# Patient Record
Sex: Female | Born: 1950
Health system: Southern US, Community
[De-identification: ages and names within clinical notes are randomized; demographics above are authoritative.]

## PROBLEM LIST (undated history)

## (undated) DIAGNOSIS — E785 Hyperlipidemia, unspecified: Secondary | ICD-10-CM

## (undated) DIAGNOSIS — C801 Malignant (primary) neoplasm, unspecified: Secondary | ICD-10-CM

## (undated) DIAGNOSIS — I10 Essential (primary) hypertension: Secondary | ICD-10-CM

## (undated) DIAGNOSIS — K219 Gastro-esophageal reflux disease without esophagitis: Secondary | ICD-10-CM

## (undated) DIAGNOSIS — M1990 Unspecified osteoarthritis, unspecified site: Secondary | ICD-10-CM

## (undated) DIAGNOSIS — G4730 Sleep apnea, unspecified: Secondary | ICD-10-CM

## (undated) DIAGNOSIS — T7840XA Allergy, unspecified, initial encounter: Secondary | ICD-10-CM

## (undated) DIAGNOSIS — F419 Anxiety disorder, unspecified: Secondary | ICD-10-CM

## (undated) DIAGNOSIS — E119 Type 2 diabetes mellitus without complications: Secondary | ICD-10-CM

## (undated) HISTORY — DX: Hyperlipidemia, unspecified: E78.5

## (undated) HISTORY — PX: TUBAL LIGATION: SHX77

## (undated) HISTORY — DX: Essential (primary) hypertension: I10

## (undated) HISTORY — PX: HEMORRHOID SURGERY: SHX153

## (undated) HISTORY — DX: Type 2 diabetes mellitus without complications: E11.9

## (undated) HISTORY — PX: STAPEDES SURGERY: SHX789

## (undated) HISTORY — PX: ABDOMINAL HYSTERECTOMY: SHX81

## (undated) HISTORY — DX: Gastro-esophageal reflux disease without esophagitis: K21.9

## (undated) HISTORY — DX: Anxiety disorder, unspecified: F41.9

## (undated) HISTORY — DX: Malignant (primary) neoplasm, unspecified: C80.1

## (undated) HISTORY — PX: CARPAL TUNNEL RELEASE: SHX101

## (undated) HISTORY — DX: Allergy, unspecified, initial encounter: T78.40XA

## (undated) HISTORY — DX: Sleep apnea, unspecified: G47.30

## (undated) HISTORY — PX: TONSILLECTOMY: SUR1361

## (undated) HISTORY — DX: Unspecified osteoarthritis, unspecified site: M19.90

---

## 2004-10-23 ENCOUNTER — Ambulatory Visit: Payer: Self-pay | Admitting: Specialist

## 2004-11-29 ENCOUNTER — Ambulatory Visit: Payer: Self-pay | Admitting: Specialist

## 2006-04-02 ENCOUNTER — Ambulatory Visit: Payer: Self-pay | Admitting: Nurse Practitioner

## 2006-04-18 ENCOUNTER — Ambulatory Visit: Payer: Self-pay | Admitting: Nurse Practitioner

## 2011-07-14 LAB — HM DEXA SCAN: HM Dexa Scan: NORMAL

## 2016-12-17 DIAGNOSIS — E785 Hyperlipidemia, unspecified: Secondary | ICD-10-CM | POA: Diagnosis not present

## 2016-12-17 DIAGNOSIS — F411 Generalized anxiety disorder: Secondary | ICD-10-CM | POA: Diagnosis not present

## 2016-12-17 DIAGNOSIS — M2550 Pain in unspecified joint: Secondary | ICD-10-CM | POA: Diagnosis not present

## 2016-12-17 DIAGNOSIS — E119 Type 2 diabetes mellitus without complications: Secondary | ICD-10-CM | POA: Diagnosis not present

## 2016-12-17 DIAGNOSIS — G2581 Restless legs syndrome: Secondary | ICD-10-CM | POA: Diagnosis not present

## 2016-12-17 DIAGNOSIS — I10 Essential (primary) hypertension: Secondary | ICD-10-CM | POA: Diagnosis not present

## 2017-01-12 DIAGNOSIS — Z13820 Encounter for screening for osteoporosis: Secondary | ICD-10-CM | POA: Diagnosis not present

## 2017-01-12 DIAGNOSIS — Z8542 Personal history of malignant neoplasm of other parts of uterus: Secondary | ICD-10-CM | POA: Diagnosis not present

## 2017-01-12 DIAGNOSIS — Z01419 Encounter for gynecological examination (general) (routine) without abnormal findings: Secondary | ICD-10-CM | POA: Diagnosis not present

## 2017-05-07 ENCOUNTER — Ambulatory Visit (INDEPENDENT_AMBULATORY_CARE_PROVIDER_SITE_OTHER): Payer: Medicare Other | Admitting: Family Medicine

## 2017-05-07 ENCOUNTER — Encounter: Payer: Self-pay | Admitting: Family Medicine

## 2017-05-07 VITALS — BP 115/86 | HR 86 | Temp 98.9°F | Resp 17 | Ht 69.0 in | Wt 202.2 lb

## 2017-05-07 DIAGNOSIS — Z1231 Encounter for screening mammogram for malignant neoplasm of breast: Secondary | ICD-10-CM | POA: Diagnosis not present

## 2017-05-07 DIAGNOSIS — K219 Gastro-esophageal reflux disease without esophagitis: Secondary | ICD-10-CM | POA: Diagnosis not present

## 2017-05-07 DIAGNOSIS — I10 Essential (primary) hypertension: Secondary | ICD-10-CM | POA: Insufficient documentation

## 2017-05-07 DIAGNOSIS — Z1239 Encounter for other screening for malignant neoplasm of breast: Secondary | ICD-10-CM

## 2017-05-07 DIAGNOSIS — E785 Hyperlipidemia, unspecified: Secondary | ICD-10-CM | POA: Diagnosis not present

## 2017-05-07 DIAGNOSIS — Z9109 Other allergy status, other than to drugs and biological substances: Secondary | ICD-10-CM

## 2017-05-07 DIAGNOSIS — M159 Polyosteoarthritis, unspecified: Secondary | ICD-10-CM

## 2017-05-07 DIAGNOSIS — E119 Type 2 diabetes mellitus without complications: Secondary | ICD-10-CM

## 2017-05-07 DIAGNOSIS — E118 Type 2 diabetes mellitus with unspecified complications: Secondary | ICD-10-CM | POA: Insufficient documentation

## 2017-05-07 DIAGNOSIS — F411 Generalized anxiety disorder: Secondary | ICD-10-CM

## 2017-05-07 DIAGNOSIS — G2581 Restless legs syndrome: Secondary | ICD-10-CM | POA: Diagnosis not present

## 2017-05-07 MED ORDER — METFORMIN HCL ER 500 MG PO TB24: 500.0000 mg | ORAL_TABLET | Freq: Every day | ORAL | 3 refills | Status: DC

## 2017-05-07 MED ORDER — ROPINIROLE HCL 1 MG PO TABS: 1.0000 mg | ORAL_TABLET | Freq: Every day | ORAL | 3 refills | Status: DC

## 2017-05-07 MED ORDER — MELOXICAM 15 MG PO TABS: 15.0000 mg | ORAL_TABLET | Freq: Every day | ORAL | 3 refills | Status: DC

## 2017-05-07 NOTE — Progress Notes (Signed)
Chief Complaint  Patient presents with  . Establish Care   Recent move to area Is primary caregiver for husband with alzheimers By history her diabetes is well controlled with last A1c "about 6 1/2" Well controlled BP on an ACE Is on Mevacor Had labs about one month ago Takes citalopram for anxiety with good result No reg exercise No acute complaint Chronic right knee pain and left shoulder pain.  Is on voltaren.  Had better result on mobic.  No x rays or treatment. No old records to review Due for eye exam No regular dental  Mammogram 2 y ago Never had dexa Doesn't need PAP Colo 4 years ago States had had all immunizations    Patient Active Problem List   Diagnosis Date Noted  . Controlled type 2 diabetes mellitus without complication, without long-term current use of insulin ([place]) 05/07/2017  . Essential hypertension 05/07/2017  . HLD (hyperlipidemia) 05/07/2017  . GAD (generalized anxiety disorder) 05/07/2017  . Environmental allergies 05/07/2017  . Restless legs syndrome (RLS) 05/07/2017  . Chronic GERD 05/07/2017  . Generalized osteoarthritis of multiple sites 05/07/2017    Outpatient Encounter Prescriptions as of 05/07/2017  Medication Sig  . b complex vitamins tablet Take 1 tablet by mouth daily.  . cholecalciferol (VITAMIN D) 1000 units tablet Take 1,000 Units by mouth daily.  . citalopram (CELEXA) 20 MG tablet Take 20 mg by mouth.  . diphenhydrAMINE (BENADRYL) 25 MG tablet Take 25 mg by mouth every 6 (six) hours as needed.  Marland Kitchen lisinopril (PRINIVIL,ZESTRIL) 5 MG tablet Take 5 mg by mouth daily.  Marland Kitchen lovastatin (MEVACOR) 20 MG tablet Take 20 mg by mouth.  . metFORMIN (GLUCOPHAGE) 500 MG tablet Take 500 mg by mouth.  . ranitidine (ZANTAC) 75 MG tablet Take 75 mg by mouth 2 (two) times daily.  Marland Kitchen rOPINIRole (REQUIP) 1 MG tablet Take 1 tablet (1 mg total) by mouth at bedtime.  . vitamin E 400 UNIT capsule Take 400 Units by mouth daily.  . meloxicam (MOBIC) 15 MG  tablet Take 1 tablet (15 mg total) by mouth daily.   No facility-administered encounter medications on file as of 05/07/2017.     Past Medical History:  Diagnosis Date  . Allergy    hay fever  . Anxiety   . Arthritis    R knee. R foot, L [place]  . Cancer [facility])    hysterectomy 2009 cervical  . Diabetes mellitus without complication ([place])   . GERD (gastroesophageal reflux disease)   . Hyperlipidemia   . Hypertension   . Sleep apnea    no longer uses    Past Surgical History:  Procedure Laterality Date  . ABDOMINAL HYSTERECTOMY     2009  . CARPAL TUNNEL RELEASE     both hands  . HEMORRHOID SURGERY     in high school  . STAPEDES SURGERY     had 3 surgeries  . TONSILLECTOMY    . TUBAL LIGATION      Social History   Social History  . Marital status: Married    Spouse name: Yvone Neu  . Number of children: 2  . Years of education: 12   Occupational History  . retired     Medical sales representative, self   Social History Main Topics  . Smoking status: Never Smoker  . Smokeless tobacco: Never Used  . Alcohol use No  . Drug use: No  . Sexual activity: No   Other Topics Concern  . Not on file  Social History Narrative   Moved from [place]   Lives with husband with Alzheimers   Has 4 little dogs   Rented trailer    Family History  Problem Relation Age of Onset  . Arthritis Mother        "crippled"  . Diabetes Mother   . Hearing loss Mother   . Heart disease Mother   . Hearing loss Father   . Vision loss Father   . Cancer Brother        bladder  . Diabetes Brother   . Hyperlipidemia Brother   . Early death Maternal Grandfather     Review of Systems  Constitutional: Negative for chills, fever and weight loss.  HENT: Negative for congestion and hearing loss.        Dental pain  Eyes: Negative for blurred vision and pain.  Respiratory: Negative for cough and shortness of breath.   Cardiovascular: Negative for chest pain and leg swelling.  Gastrointestinal: Negative for  abdominal pain, constipation, diarrhea and heartburn.  Genitourinary: Negative for dysuria and frequency.  Musculoskeletal: Positive for joint pain. Negative for falls and myalgias.  Neurological: Negative for dizziness, seizures and headaches.  Psychiatric/Behavioral: Negative for depression. The patient is not nervous/anxious and does not have insomnia.     BP 115/86 (BP Location: Left Arm, Patient Position: Sitting, Cuff Size: Normal)   Pulse 86   Temp 98.9 F (37.2 C) (Other (Comment))   Resp 17   Ht 5\' 9"  (1.753 m)   Wt 202 lb 4 oz (91.7 kg)   SpO2 97%   BMI 29.87 kg/m   Physical Exam  Constitutional: She is oriented to person, place, and time. She appears well-developed and well-nourished.  HENT:  Head: Normocephalic and atraumatic.  Mouth/Throat: Oropharynx is clear and moist.  Eyes: Pupils are equal, round, and reactive to light. Conjunctivae are normal.  Neck: Normal range of motion. Neck supple. No thyromegaly present.  Cardiovascular: Normal rate, regular rhythm and normal heart sounds.   Pulmonary/Chest: Effort normal and breath sounds normal. No respiratory distress.  Musculoskeletal: Normal range of motion. She exhibits no edema.  Right knee with warmth and slight effusion.  FROM.  Medial joint line tender.  Left shoulder demonstrates pain with internal rotation and mild pos impingement  Lymphadenopathy:    She has no cervical adenopathy.  Neurological: She is alert and oriented to person, place, and time.  Gait normal  Skin: Skin is warm and dry.  Psychiatric: She has a normal mood and affect. Her behavior is normal. Thought content normal.  Nursing note and vitals reviewed. ASSESSMENT/PLAN:   1. Controlled type 2 diabetes mellitus without complication, without long-term current use of insulin ([place]) On metformin  2. Essential hypertension controlled  3. Hyperlipidemia, unspecified hyperlipidemia type Will get labs  4. GAD (generalized anxiety  disorder) controlled  5. Environmental allergies Prn benadryl  6. Restless legs syndrome (RLS) On requip  7. Chronic GERD Controlled with OTC  8. Generalized osteoarthritis of multiple sites Switch voltaren to mobic  9. Screening for breast cancer - MM Digital Screening; Future   Patient Instructions  Change voltaren to mobic Take once a day with food Watch for stomach upset  I have refilled the requip 1 mg  Stay as active as you can manage  Need old records  Come back for labs and a check up / PE in 3 months   Audrey Everts, MD

## 2017-05-07 NOTE — Patient Instructions (Signed)
Change voltaren to mobic Take once a day with food Watch for stomach upset  I have refilled the requip 1 mg  Stay as active as you can manage  Need old records  Come back for labs and a check up / PE in 3 months

## 2017-05-11 ENCOUNTER — Other Ambulatory Visit: Payer: Self-pay

## 2017-05-11 MED ORDER — CITALOPRAM HYDROBROMIDE 20 MG PO TABS: 20.0000 mg | ORAL_TABLET | Freq: Every day | ORAL | 3 refills | Status: DC

## 2017-05-12 ENCOUNTER — Encounter: Payer: Self-pay | Admitting: Family Medicine

## 2017-05-13 ENCOUNTER — Other Ambulatory Visit: Payer: Self-pay

## 2017-05-13 ENCOUNTER — Telehealth: Payer: Self-pay

## 2017-05-13 MED ORDER — CITALOPRAM HYDROBROMIDE 40 MG PO TABS: 40.0000 mg | ORAL_TABLET | Freq: Every day | ORAL | 3 refills | Status: DC

## 2017-05-13 NOTE — Telephone Encounter (Signed)
Sure, she can take 40 a day

## 2017-05-13 NOTE — Telephone Encounter (Signed)
Do you want to increase to 40 mg?

## 2017-05-15 LAB — HM DIABETES EYE EXAM

## 2017-06-24 ENCOUNTER — Ambulatory Visit (INDEPENDENT_AMBULATORY_CARE_PROVIDER_SITE_OTHER): Payer: Medicare Other

## 2017-06-24 DIAGNOSIS — Z23 Encounter for immunization: Secondary | ICD-10-CM

## 2017-06-26 ENCOUNTER — Other Ambulatory Visit: Payer: Self-pay | Admitting: Family Medicine

## 2017-06-26 MED ORDER — LISINOPRIL 5 MG PO TABS: 5.0000 mg | ORAL_TABLET | Freq: Every day | ORAL | 3 refills | Status: DC

## 2017-08-07 ENCOUNTER — Ambulatory Visit: Payer: Medicare Other | Admitting: Family Medicine

## 2017-08-25 ENCOUNTER — Encounter: Payer: Self-pay | Admitting: Family Medicine

## 2017-08-25 ENCOUNTER — Ambulatory Visit (INDEPENDENT_AMBULATORY_CARE_PROVIDER_SITE_OTHER): Payer: Medicare Other | Admitting: Family Medicine

## 2017-08-25 ENCOUNTER — Other Ambulatory Visit: Payer: Self-pay

## 2017-08-25 VITALS — BP 118/68 | HR 84 | Temp 97.8°F | Resp 16 | Ht 69.0 in | Wt 202.0 lb

## 2017-08-25 DIAGNOSIS — E785 Hyperlipidemia, unspecified: Secondary | ICD-10-CM

## 2017-08-25 DIAGNOSIS — Z1231 Encounter for screening mammogram for malignant neoplasm of breast: Secondary | ICD-10-CM

## 2017-08-25 DIAGNOSIS — Z780 Asymptomatic menopausal state: Secondary | ICD-10-CM

## 2017-08-25 DIAGNOSIS — Z1239 Encounter for other screening for malignant neoplasm of breast: Secondary | ICD-10-CM

## 2017-08-25 DIAGNOSIS — E119 Type 2 diabetes mellitus without complications: Secondary | ICD-10-CM | POA: Diagnosis not present

## 2017-08-25 DIAGNOSIS — Z1159 Encounter for screening for other viral diseases: Secondary | ICD-10-CM

## 2017-08-25 DIAGNOSIS — E538 Deficiency of other specified B group vitamins: Secondary | ICD-10-CM | POA: Diagnosis not present

## 2017-08-25 DIAGNOSIS — E559 Vitamin D deficiency, unspecified: Secondary | ICD-10-CM | POA: Diagnosis not present

## 2017-08-25 DIAGNOSIS — I10 Essential (primary) hypertension: Secondary | ICD-10-CM

## 2017-08-25 DIAGNOSIS — K219 Gastro-esophageal reflux disease without esophagitis: Secondary | ICD-10-CM | POA: Diagnosis not present

## 2017-08-25 NOTE — Progress Notes (Signed)
Chief Complaint  Patient presents with  . Follow-up    3 month  Patient is feeling well.  She is here for routine follow-up. She has not yet gotten her mammogram.  Mammogram and DEXA scan are reordered for her. She is due for blood work. She states that she feels well.  She thinks her sugars have been well controlled.  She has not had any symptoms or high or low sugar. Her blood pressure is well controlled. She has no heartburn while taking her Zantac.  She is on Mobic for arthritis pain.  She is due for kidney function testing. She has not been screened for hepatitis C.  This is ordered today. She does not have any neuropathy or foot problems.  Foot exam is normal today. She acknowledges that she is due for an ophthalmology visit.   Patient Active Problem List   Diagnosis Date Noted  . Controlled type 2 diabetes mellitus without complication, without long-term current use of insulin ([place]) 05/07/2017  . Essential hypertension 05/07/2017  . HLD (hyperlipidemia) 05/07/2017  . GAD (generalized anxiety disorder) 05/07/2017  . Environmental allergies 05/07/2017  . Restless legs syndrome (RLS) 05/07/2017  . Chronic GERD 05/07/2017  . Generalized osteoarthritis of multiple sites 05/07/2017    Outpatient Encounter Medications as of 08/25/2017  Medication Sig  . b complex vitamins tablet Take 1 tablet by mouth daily.  . cholecalciferol (VITAMIN D) 1000 units tablet Take 1,000 Units by mouth daily.  . citalopram (CELEXA) 40 MG tablet Take 1 tablet (40 mg total) by mouth daily.  . diphenhydrAMINE (BENADRYL) 25 MG tablet Take 25 mg by mouth every 6 (six) hours as needed.  Marland Kitchen lisinopril (PRINIVIL,ZESTRIL) 5 MG tablet Take 1 tablet (5 mg total) by mouth daily.  Marland Kitchen lovastatin (MEVACOR) 20 MG tablet Take 20 mg by mouth.  . meloxicam (MOBIC) 15 MG tablet Take 1 tablet (15 mg total) by mouth daily.  . metFORMIN (GLUCOPHAGE-XR) 500 MG 24 hr tablet Take 1 tablet (500 mg total) by mouth daily with  breakfast.  . ranitidine (ZANTAC) 75 MG tablet Take 75 mg by mouth 2 (two) times daily.  Marland Kitchen rOPINIRole (REQUIP) 1 MG tablet Take 1 tablet (1 mg total) by mouth at bedtime.  . vitamin E 400 UNIT capsule Take 400 Units by mouth daily.   No facility-administered encounter medications on file as of 08/25/2017.     No Known Allergies  Review of Systems  Constitutional: Negative for activity change, appetite change and unexpected weight change.  HENT: Negative for congestion, dental problem, postnasal drip and rhinorrhea.   Eyes: Negative for redness and visual disturbance.  Respiratory: Negative for cough and shortness of breath.   Cardiovascular: Negative for chest pain, palpitations and leg swelling.  Gastrointestinal: Negative for abdominal pain, constipation and diarrhea.  Genitourinary: Positive for frequency. Negative for difficulty urinating and vaginal bleeding.  Musculoskeletal: Negative for arthralgias and back pain.  Neurological: Negative for dizziness and headaches.  Psychiatric/Behavioral: Negative for dysphoric mood and sleep disturbance. The patient is not nervous/anxious.     BP 118/68 (BP Location: Right Arm, Patient Position: Sitting, Cuff Size: Normal)   Pulse 84   Temp 97.8 F (36.6 C) (Temporal)   Resp 16   Ht 5\' 9"  (1.753 m)   Wt 202 lb 0.6 oz (91.6 kg)   SpO2 99%   BMI 29.84 kg/m   Physical Exam  Constitutional: She is oriented to person, place, and time. She appears well-developed and well-nourished.  Mild  overweight  HENT:  Head: Normocephalic and atraumatic.  Mouth/Throat: Oropharynx is clear and moist.  Eyes: Conjunctivae are normal. Pupils are equal, round, and reactive to light.  Neck: Normal range of motion. Neck supple. No thyromegaly present.  Cardiovascular: Normal rate, regular rhythm and normal heart sounds.  Pulses:      Dorsalis pedis pulses are 2+ on the right side, and 2+ on the left side.       Posterior tibial pulses are 2+ on the right  side, and 2+ on the left side.  Pulmonary/Chest: Effort normal and breath sounds normal. No respiratory distress.  Musculoskeletal: Normal range of motion. She exhibits no edema.       Right foot: There is normal range of motion and no deformity.       Left foot: There is normal range of motion and no deformity.  Feet:  Right Foot:  Protective Sensation: 3 sites tested. 3 sites sensed.  Skin Integrity: Negative for ulcer, blister or skin breakdown.  Left Foot:  Protective Sensation: 3 sites tested. 3 sites sensed.  Skin Integrity: Negative for ulcer, blister or skin breakdown.  Lymphadenopathy:    She has no cervical adenopathy.  Neurological: She is alert and oriented to person, place, and time.  Gait normal  Skin: Skin is warm and dry.  Psychiatric: She has a normal mood and affect. Her behavior is normal. Thought content normal.  Nursing note and vitals reviewed.   ASSESSMENT/PLAN:  1. Hyperlipidemia, unspecified hyperlipidemia type Compliant with Mevacor.  To get lipid panel today.  2. Essential hypertension Compliant with medication.  Blood pressure well controlled.  3. Chronic GERD Symptoms controlled on Zantac  4. Controlled type 2 diabetes mellitus without complication, without long-term current use of insulin ([place]) Controlled by patient history.  She does not check her sugar regularly.  We will check a hemoglobin A1c today. - COMPLETE METABOLIC PANEL WITH GFR - Hemoglobin A1c - CBC - Urinalysis, Routine w reflex microscopic - Lipid panel  5. Vitamin D deficiency Remote history - VITAMIN D 25 Hydroxy (Vit-D Deficiency, Fractures)  6. Vitamin B12 deficiency Remote history - Vitamin B12  7. Encounter for hepatitis C screening test for low risk patient Discussed with patient - Hepatitis C antibody  8. Screening for breast cancer Overdue - MM Digital Screening; Future  9. Post-menopausal Overdue.  Last done in 2012.  Normal - DG Bone Density;  Future   Patient Instructions  Need lab testing Continue to eat well and stay active I will send you a letter with your test results.  If there is anything of concern, we will call right away. See me every six month   Raylene Everts, MD

## 2017-08-25 NOTE — Patient Instructions (Signed)
Need lab testing Continue to eat well and stay active I will send you a letter with your test results.  If there is anything of concern, we will call right away. See me every six month

## 2017-08-26 ENCOUNTER — Other Ambulatory Visit: Payer: Self-pay | Admitting: Family Medicine

## 2017-08-26 LAB — URINALYSIS, ROUTINE W REFLEX MICROSCOPIC
BILIRUBIN URINE: NEGATIVE
Glucose, UA: NEGATIVE
Hgb urine dipstick: NEGATIVE
KETONES UR: NEGATIVE
Leukocytes, UA: NEGATIVE
NITRITE: NEGATIVE
PROTEIN: NEGATIVE
SPECIFIC GRAVITY, URINE: 1.025 (ref 1.001–1.03)
pH: 7 (ref 5.0–8.0)

## 2017-08-26 LAB — COMPLETE METABOLIC PANEL WITH GFR
AG RATIO: 1.6 (calc) (ref 1.0–2.5)
ALBUMIN MSPROF: 4.1 g/dL (ref 3.6–5.1)
ALKALINE PHOSPHATASE (APISO): 51 U/L (ref 33–130)
ALT: 12 U/L (ref 6–29)
AST: 14 U/L (ref 10–35)
BILIRUBIN TOTAL: 0.4 mg/dL (ref 0.2–1.2)
BUN: 15 mg/dL (ref 7–25)
CHLORIDE: 100 mmol/L (ref 98–110)
CO2: 32 mmol/L (ref 20–32)
Calcium: 9.4 mg/dL (ref 8.6–10.4)
Creat: 0.85 mg/dL (ref 0.50–0.99)
GFR, EST AFRICAN AMERICAN: 82 mL/min/{1.73_m2} (ref >=60)
GFR, Est Non African American: 70 mL/min/{1.73_m2} (ref >=60)
GLOBULIN: 2.5 g/dL (ref 1.9–3.7)
Glucose, Bld: 122 mg/dL — ABNORMAL HIGH (ref 65–99)
Potassium: 4 mmol/L (ref 3.5–5.3)
SODIUM: 137 mmol/L (ref 135–146)
TOTAL PROTEIN: 6.6 g/dL (ref 6.1–8.1)

## 2017-08-26 LAB — LIPID PANEL
CHOL/HDL RATIO: 3.6 (calc) (ref <5.0)
CHOLESTEROL: 233 mg/dL — AB (ref <200)
HDL: 65 mg/dL (ref >50)
LDL Cholesterol (Calc): 143 mg/dL (calc) — ABNORMAL HIGH
NON-HDL CHOLESTEROL (CALC): 168 mg/dL — AB (ref <130)
Triglycerides: 130 mg/dL (ref <150)

## 2017-08-26 LAB — VITAMIN D 25 HYDROXY (VIT D DEFICIENCY, FRACTURES): VIT D 25 HYDROXY: 36 ng/mL (ref 30–100)

## 2017-08-26 LAB — CBC
HCT: 38.5 % (ref 35.0–45.0)
HEMOGLOBIN: 13.1 g/dL (ref 11.7–15.5)
MCH: 30.8 pg (ref 27.0–33.0)
MCHC: 34 g/dL (ref 32.0–36.0)
MCV: 90.6 fL (ref 80.0–100.0)
MPV: 10 fL (ref 7.5–12.5)
Platelets: 281 10*3/uL (ref 140–400)
RBC: 4.25 10*6/uL (ref 3.80–5.10)
RDW: 12.1 % (ref 11.0–15.0)
WBC: 6.2 10*3/uL (ref 3.8–10.8)

## 2017-08-26 LAB — HEPATITIS C ANTIBODY
Hepatitis C Ab: NONREACTIVE
SIGNAL TO CUT-OFF: 0.14 (ref <1.00)

## 2017-08-26 LAB — HEMOGLOBIN A1C
HEMOGLOBIN A1C: 5.9 %{Hb} — AB (ref <5.7)
Mean Plasma Glucose: 123 (calc)
eAG (mmol/L): 6.8 (calc)

## 2017-08-26 LAB — VITAMIN B12: Vitamin B-12: 487 pg/mL (ref 200–1100)

## 2017-08-26 MED ORDER — LOVASTATIN 40 MG PO TABS: 40.0000 mg | ORAL_TABLET | Freq: Every day | ORAL | 3 refills | Status: DC

## 2017-09-15 ENCOUNTER — Other Ambulatory Visit: Payer: Self-pay

## 2017-10-05 ENCOUNTER — Encounter: Payer: Self-pay | Admitting: Family Medicine

## 2017-10-07 ENCOUNTER — Other Ambulatory Visit: Payer: Self-pay

## 2017-10-07 MED ORDER — HYDROCHLOROTHIAZIDE 25 MG PO TABS: 25.0000 mg | ORAL_TABLET | Freq: Every day | ORAL | 3 refills | Status: DC

## 2017-10-07 NOTE — Telephone Encounter (Signed)
Seen 11 20 18

## 2017-10-27 ENCOUNTER — Encounter: Payer: Self-pay | Admitting: Family Medicine

## 2017-11-09 ENCOUNTER — Other Ambulatory Visit: Payer: Self-pay

## 2017-11-09 ENCOUNTER — Encounter: Payer: Self-pay | Admitting: Family Medicine

## 2017-11-09 ENCOUNTER — Ambulatory Visit (INDEPENDENT_AMBULATORY_CARE_PROVIDER_SITE_OTHER): Payer: Medicare HMO | Admitting: Family Medicine

## 2017-11-09 VITALS — BP 114/68 | HR 80 | Temp 98.6°F | Resp 18 | Ht 69.0 in | Wt 209.0 lb

## 2017-11-09 DIAGNOSIS — S40862A Insect bite (nonvenomous) of left upper arm, initial encounter: Secondary | ICD-10-CM

## 2017-11-09 DIAGNOSIS — M25552 Pain in left hip: Secondary | ICD-10-CM | POA: Diagnosis not present

## 2017-11-09 DIAGNOSIS — W57XXXA Bitten or stung by nonvenomous insect and other nonvenomous arthropods, initial encounter: Secondary | ICD-10-CM

## 2017-11-09 DIAGNOSIS — G8929 Other chronic pain: Secondary | ICD-10-CM | POA: Diagnosis not present

## 2017-11-09 MED ORDER — TRAMADOL HCL 50 MG PO TABS: 50.0000 mg | ORAL_TABLET | Freq: Four times a day (QID) | ORAL | 0 refills | Status: DC | PRN

## 2017-11-09 NOTE — Progress Notes (Signed)
Chief Complaint  Patient presents with  . Tick Removal   Patient is here for 2 reasons. She is seen today as a walk-in for tick removal.  She has a tick on her left breast/axilla region.  She is unable to see it adequately to get it off. She is also complaining of chronic left hip pain.  This is been going on for some time.  She takes Mobic every day.  She points to her SI area.  It hurts in her hip and buttock.  Is worse with activity and better with rest.  It does bother her somewhat at night.  No trauma or accident.  She is always physically active, [place]  Patient Active Problem List   Diagnosis Date Noted  . Controlled type 2 diabetes mellitus without complication, without long-term current use of insulin ([place]) 05/07/2017  . Essential hypertension 05/07/2017  . HLD (hyperlipidemia) 05/07/2017  . GAD (generalized anxiety disorder) 05/07/2017  . Environmental allergies 05/07/2017  . Restless legs syndrome (RLS) 05/07/2017  . Chronic GERD 05/07/2017  . Generalized osteoarthritis of multiple sites 05/07/2017    Outpatient Encounter Medications as of 11/09/2017  Medication Sig  . b complex vitamins tablet Take 1 tablet by mouth daily.  . cholecalciferol (VITAMIN D) 1000 units tablet Take 1,000 Units by mouth daily.  . citalopram (CELEXA) 40 MG tablet Take 1 tablet (40 mg total) by mouth daily.  . diphenhydrAMINE (BENADRYL) 25 MG tablet Take 25 mg by mouth every 6 (six) hours as needed.  . hydrochlorothiazide (HYDRODIURIL) 25 MG tablet Take 1 tablet (25 mg total) by mouth daily.  Marland Kitchen lisinopril (PRINIVIL,ZESTRIL) 5 MG tablet Take 1 tablet (5 mg total) by mouth daily.  Marland Kitchen lovastatin (MEVACOR) 40 MG tablet Take 1 tablet (40 mg total) by mouth daily at 8 pm.  . meloxicam (MOBIC) 15 MG tablet Take 1 tablet (15 mg total) by mouth daily.  . metFORMIN (GLUCOPHAGE-XR) 500 MG 24 hr tablet Take 1 tablet (500 mg total) by mouth daily with breakfast.  . ranitidine (ZANTAC) 75  MG tablet Take 75 mg by mouth 2 (two) times daily.  Marland Kitchen rOPINIRole (REQUIP) 1 MG tablet Take 1 tablet (1 mg total) by mouth at bedtime.  . vitamin E 400 UNIT capsule Take 400 Units by mouth daily.  . traMADol (ULTRAM) 50 MG tablet Take 1 tablet (50 mg total) by mouth every 6 (six) hours as needed.   No facility-administered encounter medications on file as of 11/09/2017.     No Known Allergies  Review of Systems  Constitutional: Negative for activity change, appetite change and unexpected weight change.  HENT: Negative for congestion, dental problem, postnasal drip and rhinorrhea.   Eyes: Negative for redness and visual disturbance.  Respiratory: Negative for cough and shortness of breath.   Cardiovascular: Negative for chest pain, palpitations and leg swelling.  Gastrointestinal: Negative for abdominal pain, constipation and diarrhea.  Genitourinary: Negative for difficulty urinating, frequency and menstrual problem.  Musculoskeletal: Positive for arthralgias and gait problem. Negative for back pain.  Skin: Positive for wound.  Neurological: Negative for dizziness and headaches.  Psychiatric/Behavioral: Negative for dysphoric mood and sleep disturbance. The patient is not nervous/anxious.     BP 114/68 (BP Location: Right Arm, Patient Position: Sitting, Cuff Size: Normal)   Pulse 80   Temp 98.6 F (37 C) (Temporal)   Resp 18   Ht 5\' 9"  (1.753 m)   Wt 209 lb 0.6 oz (94.8 kg)  SpO2 99%   BMI 30.87 kg/m   Physical Exam  Constitutional: She appears well-developed and well-nourished. No distress.  HENT:  Head: Normocephalic.  Mouth/Throat: Oropharynx is clear and moist.  Eyes: Conjunctivae are normal. Pupils are equal, round, and reactive to light.  Neck: Normal range of motion.  Cardiovascular: Normal rate, regular rhythm and normal heart sounds.  Pulmonary/Chest: Effort normal and breath sounds normal.  Musculoskeletal: Normal range of motion. She exhibits no edema.  Mild to  moderate tenderness to palpation over left SI joint.  Spine is straight and symmetric.  No muscular spasm or tenderness.  Strength sensation range of motion and reflexes are normal and lower extremities.  No pain with range of motion of left hip.  Lymphadenopathy:    She has no cervical adenopathy.  Skin:  In left anterior axillary region there is a mildly excoriated wound with a black center that looks like tick mouth parts.  With needle and tweezers this is removed.  The area is cleansed.  Band-Aid is placed.    ASSESSMENT/PLAN:  1. Tick bite of axillary region, left, initial encounter Discussed the patient not need prophylaxis for Lyme disease given the short period of time the tick was attached to her skin.  She has no erythema or signs of infection.  2. Chronic hip pain, left She is already on Mobic daily.  Will add tramadol as needed severe pain.  Stretching and range of motion is recommended.   Patient Instructions  Watch tick bite area for signs of infection  Take mobic once a day for the hip pain Take tramadol if needed for pain  See me as scheduled   Raylene Everts, MD

## 2017-11-09 NOTE — Patient Instructions (Signed)
Watch tick bite area for signs of infection  Take mobic once a day for the hip pain Take tramadol if needed for pain  See me as scheduled

## 2017-12-14 ENCOUNTER — Encounter: Payer: Self-pay | Admitting: Family Medicine

## 2018-02-22 ENCOUNTER — Ambulatory Visit: Payer: Medicare Other | Admitting: Family Medicine

## 2018-03-02 DIAGNOSIS — E7849 Other hyperlipidemia: Secondary | ICD-10-CM | POA: Diagnosis not present

## 2018-03-02 DIAGNOSIS — R5382 Chronic fatigue, unspecified: Secondary | ICD-10-CM | POA: Diagnosis not present

## 2018-03-02 DIAGNOSIS — M2550 Pain in unspecified joint: Secondary | ICD-10-CM | POA: Diagnosis not present

## 2018-03-24 ENCOUNTER — Other Ambulatory Visit (HOSPITAL_COMMUNITY): Payer: Self-pay | Admitting: Family Medicine

## 2018-03-24 ENCOUNTER — Ambulatory Visit (HOSPITAL_COMMUNITY)
Admission: RE | Admit: 2018-03-24 | Discharge: 2018-03-24 | Disposition: A | Discharge status: Home / Self Care | Payer: Medicare HMO | Source: Ambulatory Visit | Attending: Family Medicine | Admitting: Family Medicine

## 2018-03-24 DIAGNOSIS — R52 Pain, unspecified: Secondary | ICD-10-CM

## 2018-03-24 DIAGNOSIS — M2011 Hallux valgus (acquired), right foot: Secondary | ICD-10-CM | POA: Diagnosis not present

## 2018-03-24 DIAGNOSIS — R5383 Other fatigue: Secondary | ICD-10-CM | POA: Diagnosis not present

## 2018-03-24 DIAGNOSIS — I110 Hypertensive heart disease with heart failure: Secondary | ICD-10-CM | POA: Diagnosis not present

## 2018-03-24 DIAGNOSIS — E559 Vitamin D deficiency, unspecified: Secondary | ICD-10-CM | POA: Diagnosis not present

## 2018-03-24 DIAGNOSIS — E7849 Other hyperlipidemia: Secondary | ICD-10-CM | POA: Diagnosis not present

## 2018-03-24 DIAGNOSIS — E1165 Type 2 diabetes mellitus with hyperglycemia: Secondary | ICD-10-CM | POA: Diagnosis not present

## 2018-03-24 DIAGNOSIS — M25571 Pain in right ankle and joints of right foot: Secondary | ICD-10-CM | POA: Diagnosis not present

## 2018-03-24 DIAGNOSIS — D510 Vitamin B12 deficiency anemia due to intrinsic factor deficiency: Secondary | ICD-10-CM | POA: Diagnosis not present

## 2018-03-24 DIAGNOSIS — M79671 Pain in right foot: Secondary | ICD-10-CM | POA: Diagnosis not present

## 2018-04-30 DIAGNOSIS — E7849 Other hyperlipidemia: Secondary | ICD-10-CM | POA: Diagnosis not present

## 2018-04-30 DIAGNOSIS — R5382 Chronic fatigue, unspecified: Secondary | ICD-10-CM | POA: Diagnosis not present

## 2018-04-30 DIAGNOSIS — I110 Hypertensive heart disease with heart failure: Secondary | ICD-10-CM | POA: Diagnosis not present

## 2018-04-30 DIAGNOSIS — E1165 Type 2 diabetes mellitus with hyperglycemia: Secondary | ICD-10-CM | POA: Diagnosis not present

## 2018-06-04 ENCOUNTER — Other Ambulatory Visit: Payer: Self-pay | Admitting: Family Medicine

## 2018-08-11 DIAGNOSIS — M7552 Bursitis of left shoulder: Secondary | ICD-10-CM | POA: Diagnosis not present

## 2018-08-11 DIAGNOSIS — I110 Hypertensive heart disease with heart failure: Secondary | ICD-10-CM | POA: Diagnosis not present

## 2018-08-11 DIAGNOSIS — J309 Allergic rhinitis, unspecified: Secondary | ICD-10-CM | POA: Diagnosis not present

## 2018-08-11 DIAGNOSIS — E7849 Other hyperlipidemia: Secondary | ICD-10-CM | POA: Diagnosis not present

## 2018-08-11 DIAGNOSIS — R69 Illness, unspecified: Secondary | ICD-10-CM | POA: Diagnosis not present

## 2018-08-16 ENCOUNTER — Ambulatory Visit (HOSPITAL_COMMUNITY)
Admission: RE | Admit: 2018-08-16 | Discharge: 2018-08-16 | Disposition: A | Discharge status: Home / Self Care | Payer: Medicare HMO | Source: Ambulatory Visit | Attending: Family Medicine | Admitting: Family Medicine

## 2018-08-16 ENCOUNTER — Other Ambulatory Visit (HOSPITAL_COMMUNITY): Payer: Self-pay | Admitting: Family Medicine

## 2018-08-16 DIAGNOSIS — M7552 Bursitis of left shoulder: Secondary | ICD-10-CM

## 2018-08-16 DIAGNOSIS — M19012 Primary osteoarthritis, left shoulder: Secondary | ICD-10-CM | POA: Diagnosis not present

## 2018-09-07 ENCOUNTER — Other Ambulatory Visit: Payer: Self-pay

## 2018-09-07 ENCOUNTER — Ambulatory Visit: Payer: Medicare HMO

## 2018-09-07 DIAGNOSIS — E119 Type 2 diabetes mellitus without complications: Secondary | ICD-10-CM

## 2018-09-07 MED ORDER — METFORMIN HCL ER 500 MG PO TB24: 500.0000 mg | ORAL_TABLET | Freq: Every day | ORAL | 3 refills | Status: DC

## 2018-09-07 NOTE — Progress Notes (Signed)
Medication refilled

## 2018-09-20 ENCOUNTER — Ambulatory Visit: Payer: Medicare HMO

## 2019-04-07 ENCOUNTER — Other Ambulatory Visit: Payer: Medicare HMO

## 2019-04-07 ENCOUNTER — Other Ambulatory Visit: Payer: Self-pay

## 2019-04-07 DIAGNOSIS — Z20822 Contact with and (suspected) exposure to covid-19: Secondary | ICD-10-CM

## 2019-04-13 LAB — NOVEL CORONAVIRUS, NAA: SARS-CoV-2, NAA: NOT DETECTED

## 2019-05-26 ENCOUNTER — Ambulatory Visit: Payer: Medicare HMO | Admitting: Family Medicine

## 2019-06-02 ENCOUNTER — Ambulatory Visit: Payer: Medicare HMO | Admitting: Family Medicine

## 2019-07-22 ENCOUNTER — Other Ambulatory Visit: Payer: Self-pay

## 2019-07-22 DIAGNOSIS — Z20822 Contact with and (suspected) exposure to covid-19: Secondary | ICD-10-CM

## 2019-07-23 LAB — NOVEL CORONAVIRUS, NAA: SARS-CoV-2, NAA: NOT DETECTED

## 2019-12-27 ENCOUNTER — Other Ambulatory Visit: Payer: Self-pay | Admitting: Family Medicine

## 2019-12-27 DIAGNOSIS — E119 Type 2 diabetes mellitus without complications: Secondary | ICD-10-CM

## 2020-01-02 ENCOUNTER — Other Ambulatory Visit: Payer: Self-pay | Admitting: Family Medicine

## 2020-01-02 DIAGNOSIS — E119 Type 2 diabetes mellitus without complications: Secondary | ICD-10-CM

## 2020-09-14 ENCOUNTER — Other Ambulatory Visit (HOSPITAL_COMMUNITY): Payer: Self-pay | Admitting: Family Medicine

## 2020-09-14 DIAGNOSIS — M545 Low back pain, unspecified: Secondary | ICD-10-CM

## 2020-09-14 DIAGNOSIS — M1909 Primary osteoarthritis, other specified site: Secondary | ICD-10-CM

## 2020-11-14 DIAGNOSIS — E1165 Type 2 diabetes mellitus with hyperglycemia: Secondary | ICD-10-CM | POA: Diagnosis not present

## 2020-11-14 DIAGNOSIS — R5382 Chronic fatigue, unspecified: Secondary | ICD-10-CM | POA: Diagnosis not present

## 2021-01-09 DIAGNOSIS — K219 Gastro-esophageal reflux disease without esophagitis: Secondary | ICD-10-CM | POA: Diagnosis not present

## 2021-01-09 DIAGNOSIS — E1165 Type 2 diabetes mellitus with hyperglycemia: Secondary | ICD-10-CM | POA: Diagnosis not present

## 2021-01-09 DIAGNOSIS — E7849 Other hyperlipidemia: Secondary | ICD-10-CM | POA: Diagnosis not present

## 2021-01-09 DIAGNOSIS — I110 Hypertensive heart disease with heart failure: Secondary | ICD-10-CM | POA: Diagnosis not present

## 2021-01-17 ENCOUNTER — Other Ambulatory Visit: Payer: Self-pay

## 2021-01-17 ENCOUNTER — Other Ambulatory Visit (HOSPITAL_COMMUNITY): Payer: Self-pay | Admitting: Family Medicine

## 2021-01-17 ENCOUNTER — Ambulatory Visit (HOSPITAL_COMMUNITY)
Admission: RE | Admit: 2021-01-17 | Discharge: 2021-01-17 | Disposition: A | Discharge status: Home / Self Care | Payer: Medicare HMO | Source: Ambulatory Visit | Attending: Family Medicine | Admitting: Family Medicine

## 2021-01-17 DIAGNOSIS — E230 Hypopituitarism: Secondary | ICD-10-CM | POA: Diagnosis not present

## 2021-01-17 DIAGNOSIS — M545 Low back pain, unspecified: Secondary | ICD-10-CM | POA: Diagnosis not present

## 2021-01-17 DIAGNOSIS — E288 Other ovarian dysfunction: Secondary | ICD-10-CM | POA: Diagnosis not present

## 2021-01-17 DIAGNOSIS — E280 Estrogen excess: Secondary | ICD-10-CM | POA: Diagnosis not present

## 2021-03-29 DIAGNOSIS — M2559 Pain in other specified joint: Secondary | ICD-10-CM | POA: Diagnosis not present

## 2021-03-29 DIAGNOSIS — E1165 Type 2 diabetes mellitus with hyperglycemia: Secondary | ICD-10-CM | POA: Diagnosis not present

## 2021-03-29 DIAGNOSIS — M5459 Other low back pain: Secondary | ICD-10-CM | POA: Diagnosis not present

## 2021-04-09 ENCOUNTER — Other Ambulatory Visit (HOSPITAL_COMMUNITY): Payer: Self-pay | Admitting: Family Medicine

## 2021-04-09 DIAGNOSIS — M545 Low back pain, unspecified: Secondary | ICD-10-CM

## 2021-04-19 ENCOUNTER — Encounter (HOSPITAL_COMMUNITY): Payer: Self-pay

## 2021-04-19 ENCOUNTER — Ambulatory Visit (HOSPITAL_COMMUNITY): Payer: Medicare HMO

## 2021-08-27 ENCOUNTER — Ambulatory Visit: Payer: Medicare HMO | Admitting: Nurse Practitioner

## 2021-10-29 ENCOUNTER — Encounter: Payer: Self-pay | Admitting: Nurse Practitioner

## 2021-10-29 ENCOUNTER — Other Ambulatory Visit: Payer: Self-pay

## 2021-10-29 ENCOUNTER — Ambulatory Visit (INDEPENDENT_AMBULATORY_CARE_PROVIDER_SITE_OTHER): Payer: Medicare HMO | Admitting: Nurse Practitioner

## 2021-10-29 VITALS — BP 163/91 | HR 81 | Resp 16 | Ht 71.0 in | Wt 203.0 lb

## 2021-10-29 DIAGNOSIS — E119 Type 2 diabetes mellitus without complications: Secondary | ICD-10-CM | POA: Diagnosis not present

## 2021-10-29 DIAGNOSIS — I10 Essential (primary) hypertension: Secondary | ICD-10-CM | POA: Diagnosis not present

## 2021-10-29 DIAGNOSIS — K30 Functional dyspepsia: Secondary | ICD-10-CM | POA: Diagnosis not present

## 2021-10-29 DIAGNOSIS — M159 Polyosteoarthritis, unspecified: Secondary | ICD-10-CM

## 2021-10-29 DIAGNOSIS — R69 Illness, unspecified: Secondary | ICD-10-CM | POA: Diagnosis not present

## 2021-10-29 DIAGNOSIS — E785 Hyperlipidemia, unspecified: Secondary | ICD-10-CM | POA: Diagnosis not present

## 2021-10-29 DIAGNOSIS — E559 Vitamin D deficiency, unspecified: Secondary | ICD-10-CM

## 2021-10-29 DIAGNOSIS — K219 Gastro-esophageal reflux disease without esophagitis: Secondary | ICD-10-CM

## 2021-10-29 DIAGNOSIS — Z9109 Other allergy status, other than to drugs and biological substances: Secondary | ICD-10-CM

## 2021-10-29 DIAGNOSIS — F411 Generalized anxiety disorder: Secondary | ICD-10-CM

## 2021-10-29 DIAGNOSIS — Z2821 Immunization not carried out because of patient refusal: Secondary | ICD-10-CM | POA: Diagnosis not present

## 2021-10-29 DIAGNOSIS — Z1231 Encounter for screening mammogram for malignant neoplasm of breast: Secondary | ICD-10-CM

## 2021-10-29 MED ORDER — PANTOPRAZOLE SODIUM 40 MG PO TBEC: 40.0000 mg | DELAYED_RELEASE_TABLET | Freq: Every day | ORAL | 3 refills | Status: DC

## 2021-10-29 NOTE — Assessment & Plan Note (Signed)
Takes Protonix 40 mg daily. Med refilled today

## 2021-10-29 NOTE — Assessment & Plan Note (Signed)
Takes vitamin d 1000 units daily. Will recheck labs at next visit.

## 2021-10-29 NOTE — Assessment & Plan Note (Signed)
Patient educated on CDC recommendations for vaccination.  She verbalized understanding.  Patient refused vaccination today.

## 2021-10-29 NOTE — Patient Instructions (Addendum)
Please schedule your diabetic eye exam  Get your shingles vaccine , flu vaccine and Covid vaccines at your pharmacy  Please get your labs done this week.   It is important that you exercise regularly at least 30 minutes 5 times a week.  Think about what you will eat, plan ahead. Choose " clean, green, fresh or frozen" over canned, processed or packaged foods which are more sugary, salty and fatty. 70 to 75% of food eaten should be vegetables and fruit. Three meals at set times with snacks allowed between meals, but they must be fruit or vegetables. Aim to eat over a 12 hour period , example 7 am to 7 pm, and STOP after  your last meal of the day. Drink water,generally about 64 ounces per day, no other drink is as healthy. Fruit juice is best enjoyed in a healthy way, by EATING the fruit.  Thanks for choosing [facility], we consider it a privelige to serve you.

## 2021-10-29 NOTE — Assessment & Plan Note (Signed)
DASH diet and commitment to daily physical activity for a minimum of 30 minutes discussed and encouraged, as a part of hypertension management. The importance of attaining a healthy weight is also discussed.  BP/Weight 10/29/2021 11/09/2017 17/40/9927 8/0/0447  Systolic BP 158 063 868 548  Diastolic BP 91 68 68 86  Wt. (Lbs) 203.04 209.04 202.04 202.25  BMI 28.32 30.87 29.84 29.87  bp elevated today, takes lisinopril 5mg  daily Pt will monitor BP at home, if bp still elevated at next visit will increase dose.

## 2021-10-29 NOTE — Progress Notes (Signed)
New Patient Office Visit  Subjective:  Patient ID: Audrey Ross, female    DOB: 1951-04-02  Age: 71 y.o. MRN: [id number]  CC:  Chief Complaint  Patient presents with   Establish Care   Heartburn    Pt has heartburn all time with a cough  Transferring from [place]   Back Pain    Pt states back and hip pain possibly arthritis    HPI Audrey Ross presents to establish care. Previous pcp was [facility]. Last visit to former PCP was about months ago.   Vitamin D deff. Vitamin d 1000 units. Panic attacks . Celexa 20mg   HTN. Lisinopril 5mg  daily,  DM metformin 500mg , Faxiga 10mg  HLD. Lovastatin 40mg  daily Arthritis . Meloxicam 15mg  once daily.  Indigestion. Pantoprazole 40mg  daily.    Pt c/o indigestion, protonix helps her symptoms.  Has chronic back pain and hip pain takes meloxicam 15mg  daily, med helps her pain.  Pt is due for shingles vaccine, flu vaccine , COVID vaccine, pt educated on the need to get all theses vaccine, she verbalized understanding, she refused flu vaccine today.  Past Medical History:  Diagnosis Date   Allergy    hay fever   Anxiety    Arthritis    R knee. R foot, L shoudler   Cancer ([place])    hysterectomy 2009 cervical   Diabetes mellitus without complication (HCC)    GERD (gastroesophageal reflux disease)    Hyperlipidemia    Hypertension    Sleep apnea    no longer uses    Past Surgical History:  Procedure Laterality Date   ABDOMINAL HYSTERECTOMY     2009   CARPAL TUNNEL RELEASE     both hands   HEMORRHOID SURGERY     in high school   STAPEDES SURGERY     had 3 surgeries   TONSILLECTOMY     TUBAL LIGATION      Family History  Problem Relation Age of Onset   Arthritis Mother        "crippled"   Diabetes Mother    Hearing loss Mother    Heart disease Mother    Hearing loss Father    Vision loss Father    Cancer Brother        bladder   Diabetes Brother    Hyperlipidemia Brother    Early death Maternal Grandfather      Social History   Socioeconomic History   Marital status: Married    Spouse name: Yvone Neu   Number of children: 2   Years of education: 12   Highest education level: Not on file  Occupational History   Occupation: retired    Comment: clerical, self  Tobacco Use   Smoking status: Never   Smokeless tobacco: Never  Substance and Sexual Activity   Alcohol use: No   Drug use: No   Sexual activity: Never  Other Topics Concern   Not on file  Social History Narrative   Moved from [place] with husband with Alzheimers   Has 4 little dogs   Rented trailer   Social Determinants of Radio broadcast assistant Strain: Not on file  Food Insecurity: Not on file  Transportation Needs: Not on file  Physical Activity: Not on file  Stress: Not on file  Social Connections: Not on file  Intimate Partner Violence: Not on file    ROS Review of Systems  Constitutional: Negative.   Respiratory: Negative.    Cardiovascular: Negative.  Gastrointestinal: Negative.        Has acid reflux  Musculoskeletal:  Positive for arthralgias.  Psychiatric/Behavioral: Negative.     Objective:   Today's Vitals: BP (!) 163/91    Pulse 81    Resp 16    Ht 5\' 11"  (1.803 m)    Wt 203 lb 0.6 oz (92.1 kg)    SpO2 96%    BMI 28.32 kg/m   Physical Exam Constitutional:      General: She is not in acute distress.    Appearance: She is not ill-appearing, toxic-appearing or diaphoretic.  Cardiovascular:     Rate and Rhythm: Normal rate and regular rhythm.     Heart sounds: No murmur heard.   No friction rub. No gallop.  Pulmonary:     Effort: No respiratory distress.     Breath sounds: No stridor. No rhonchi or rales.  Chest:     Chest wall: No tenderness.  Abdominal:     Palpations: There is no mass.     Tenderness: There is no abdominal tenderness. There is no guarding.  Musculoskeletal:        General: No swelling, tenderness, deformity or signs of injury.  Neurological:     Mental Status:  She is alert.  Psychiatric:        Mood and Affect: Mood normal.        Behavior: Behavior normal.        Thought Content: Thought content normal.        Judgment: Judgment normal.    Assessment & Plan:   Problem List Items Addressed This Visit   None   Outpatient Encounter Medications as of 10/29/2021  Medication Sig   cholecalciferol (VITAMIN D) 1000 units tablet Take 1,000 Units by mouth daily.   citalopram (CELEXA) 40 MG tablet Take 1 tablet (40 mg total) by mouth daily.   lisinopril (PRINIVIL,ZESTRIL) 5 MG tablet Take 1 tablet (5 mg total) by mouth daily.   lovastatin (MEVACOR) 40 MG tablet Take 1 tablet (40 mg total) by mouth daily at 8 pm.   meloxicam (MOBIC) 15 MG tablet Take 1 tablet (15 mg total) by mouth daily.   metFORMIN (GLUCOPHAGE-XR) 500 MG 24 hr tablet Take 1 tablet (500 mg total) by mouth daily with breakfast.   [DISCONTINUED] b complex vitamins tablet Take 1 tablet by mouth daily.   [DISCONTINUED] diphenhydrAMINE (BENADRYL) 25 MG tablet Take 25 mg by mouth every 6 (six) hours as needed.   [DISCONTINUED] hydrochlorothiazide (HYDRODIURIL) 25 MG tablet Take 1 tablet (25 mg total) by mouth daily.   [DISCONTINUED] ranitidine (ZANTAC) 75 MG tablet Take 75 mg by mouth 2 (two) times daily.   [DISCONTINUED] rOPINIRole (REQUIP) 1 MG tablet Take 1 tablet (1 mg total) by mouth at bedtime.   [DISCONTINUED] traMADol (ULTRAM) 50 MG tablet Take 1 tablet (50 mg total) by mouth every 6 (six) hours as needed.   [DISCONTINUED] vitamin E 400 UNIT capsule Take 400 Units by mouth daily.   No facility-administered encounter medications on file as of 10/29/2021.    Follow-up: No follow-ups on file.   Renee Rival, FNP

## 2021-10-29 NOTE — Assessment & Plan Note (Signed)
Well controlled  Continue celexa 20mg  daily

## 2021-10-29 NOTE — Assessment & Plan Note (Signed)
Lovastatin 40mg  daily Lipid panel today

## 2021-10-29 NOTE — Assessment & Plan Note (Signed)
Referred for mammogram.

## 2021-10-29 NOTE — Assessment & Plan Note (Signed)
Continue meloxicam 15mg  daily, pt told not to take med on empty stomach to reduced GI upset.

## 2021-10-29 NOTE — Assessment & Plan Note (Addendum)
Lab Results  Component Value Date   HGBA1C 5.9 (H) 08/25/2017  A1C ordered today. Takes metformin XR 500mg  daily, Faxiga 10mg  daily On lisinopril 5mg  daily Lovastatin 40mg  daily.  Diabetic eye exam scheduled today.

## 2021-11-04 ENCOUNTER — Other Ambulatory Visit: Payer: Self-pay

## 2021-11-04 ENCOUNTER — Ambulatory Visit (INDEPENDENT_AMBULATORY_CARE_PROVIDER_SITE_OTHER): Payer: Medicare HMO

## 2021-11-04 DIAGNOSIS — Z0000 Encounter for general adult medical examination without abnormal findings: Secondary | ICD-10-CM

## 2021-11-04 DIAGNOSIS — E119 Type 2 diabetes mellitus without complications: Secondary | ICD-10-CM | POA: Diagnosis not present

## 2021-11-04 DIAGNOSIS — E785 Hyperlipidemia, unspecified: Secondary | ICD-10-CM | POA: Diagnosis not present

## 2021-11-05 ENCOUNTER — Other Ambulatory Visit: Payer: Self-pay | Admitting: Nurse Practitioner

## 2021-11-05 DIAGNOSIS — E119 Type 2 diabetes mellitus without complications: Secondary | ICD-10-CM

## 2021-11-05 LAB — CMP14+EGFR
ALT: 23 IU/L (ref 0–32)
AST: 22 IU/L (ref 0–40)
Albumin/Globulin Ratio: 2.4 — ABNORMAL HIGH (ref 1.2–2.2)
Albumin: 4.3 g/dL (ref 3.7–4.7)
Alkaline Phosphatase: 59 IU/L (ref 44–121)
BUN/Creatinine Ratio: 19 (ref 12–28)
BUN: 15 mg/dL (ref 8–27)
Bilirubin Total: 0.4 mg/dL (ref 0.0–1.2)
CO2: 25 mmol/L (ref 20–29)
Calcium: 9.4 mg/dL (ref 8.7–10.3)
Chloride: 105 mmol/L (ref 96–106)
Creatinine, Ser: 0.78 mg/dL (ref 0.57–1.00)
Globulin, Total: 1.8 g/dL (ref 1.5–4.5)
Glucose: 116 mg/dL — ABNORMAL HIGH (ref 70–99)
Potassium: 4.2 mmol/L (ref 3.5–5.2)
Sodium: 141 mmol/L (ref 134–144)
Total Protein: 6.1 g/dL (ref 6.0–8.5)
eGFR: 81 mL/min/{1.73_m2} (ref >59)

## 2021-11-05 LAB — HEMOGLOBIN A1C
Est. average glucose Bld gHb Est-mCnc: 128 mg/dL
Hgb A1c MFr Bld: 6.1 % — ABNORMAL HIGH (ref 4.8–5.6)

## 2021-11-05 LAB — LIPID PANEL
Chol/HDL Ratio: 2.3 ratio (ref 0.0–4.4)
Cholesterol, Total: 164 mg/dL (ref 100–199)
HDL: 72 mg/dL (ref >39)
LDL Chol Calc (NIH): 79 mg/dL (ref 0–99)
Triglycerides: 65 mg/dL (ref 0–149)
VLDL Cholesterol Cal: 13 mg/dL (ref 5–40)

## 2021-11-05 MED ORDER — LOVASTATIN 40 MG PO TABS: 40.0000 mg | ORAL_TABLET | Freq: Every day | ORAL | 3 refills | Status: DC

## 2021-11-05 MED ORDER — DAPAGLIFLOZIN PROPANEDIOL 10 MG PO TABS: 10.0000 mg | ORAL_TABLET | Freq: Every day | ORAL | 3 refills | Status: DC

## 2021-11-05 MED ORDER — METFORMIN HCL ER 500 MG PO TB24: 500.0000 mg | ORAL_TABLET | Freq: Every day | ORAL | 3 refills | Status: DC

## 2021-11-05 MED ORDER — LISINOPRIL 5 MG PO TABS: 5.0000 mg | ORAL_TABLET | Freq: Every day | ORAL | 3 refills | Status: DC

## 2021-11-05 NOTE — Progress Notes (Signed)
Please review results with patient. A1c of 6.1, patient should continue to take farxiga10mg  daily and metformin 500mg  BID as ordered. Patient should continue to take lovastatin 40mg  daily as ordered. Continue lisinopril 5mg  daily I have refilled her medications.  Thank you

## 2021-11-19 ENCOUNTER — Telehealth: Payer: Self-pay | Admitting: Nurse Practitioner

## 2021-11-19 NOTE — Telephone Encounter (Signed)
Spoke with pt went over lab results pt verbalized understanding °

## 2021-11-19 NOTE — Telephone Encounter (Signed)
Pt returning phone call for BW results

## 2021-12-09 ENCOUNTER — Encounter: Payer: Self-pay | Admitting: Nurse Practitioner

## 2021-12-10 ENCOUNTER — Other Ambulatory Visit: Payer: Self-pay

## 2021-12-10 MED ORDER — CITALOPRAM HYDROBROMIDE 20 MG PO TABS: 20.0000 mg | ORAL_TABLET | Freq: Every day | ORAL | 3 refills | Status: DC

## 2021-12-10 NOTE — Telephone Encounter (Signed)
Please advise if this can be refill gave warning due to meloxicam  ?

## 2021-12-18 ENCOUNTER — Inpatient Hospital Stay: Admission: RE | Admit: 2021-12-18 | Payer: Medicare HMO | Source: Ambulatory Visit

## 2022-01-06 ENCOUNTER — Encounter: Payer: Self-pay | Admitting: Nurse Practitioner

## 2022-01-07 NOTE — Telephone Encounter (Signed)
Please advise 

## 2022-01-08 NOTE — Telephone Encounter (Signed)
Called pt no answer left vm sending mychart message  ?

## 2022-01-29 ENCOUNTER — Encounter: Payer: Self-pay | Admitting: Nurse Practitioner

## 2022-01-29 ENCOUNTER — Ambulatory Visit (INDEPENDENT_AMBULATORY_CARE_PROVIDER_SITE_OTHER): Payer: Medicare HMO | Admitting: Nurse Practitioner

## 2022-01-29 VITALS — BP 125/74 | HR 70 | Ht 69.0 in | Wt 202.0 lb

## 2022-01-29 DIAGNOSIS — Z0000 Encounter for general adult medical examination without abnormal findings: Secondary | ICD-10-CM

## 2022-01-29 DIAGNOSIS — S0991XA Unspecified injury of ear, initial encounter: Secondary | ICD-10-CM | POA: Diagnosis not present

## 2022-01-29 DIAGNOSIS — E118 Type 2 diabetes mellitus with unspecified complications: Secondary | ICD-10-CM

## 2022-01-29 DIAGNOSIS — Z1211 Encounter for screening for malignant neoplasm of colon: Secondary | ICD-10-CM | POA: Diagnosis not present

## 2022-01-29 DIAGNOSIS — Z0001 Encounter for general adult medical examination with abnormal findings: Secondary | ICD-10-CM

## 2022-01-29 DIAGNOSIS — E538 Deficiency of other specified B group vitamins: Secondary | ICD-10-CM

## 2022-01-29 DIAGNOSIS — I10 Essential (primary) hypertension: Secondary | ICD-10-CM

## 2022-01-29 DIAGNOSIS — E785 Hyperlipidemia, unspecified: Secondary | ICD-10-CM | POA: Diagnosis not present

## 2022-01-29 DIAGNOSIS — E559 Vitamin D deficiency, unspecified: Secondary | ICD-10-CM

## 2022-01-29 HISTORY — DX: Encounter for general adult medical examination without abnormal findings: Z00.00

## 2022-01-29 MED ORDER — DAPAGLIFLOZIN PROPANEDIOL 10 MG PO TABS: 10.0000 mg | ORAL_TABLET | Freq: Every day | ORAL | 1 refills | Status: DC

## 2022-01-29 NOTE — Patient Instructions (Addendum)
Pleas get your pneumonia vaccine at your pharmacy.   ?Schedule your diabetic eye exam today  ? ?It is important that you exercise regularly at least 30 minutes 5 times a week.  ?Think about what you will eat, plan ahead. ?Choose " clean, green, fresh or frozen" over canned, processed or packaged foods which are more sugary, salty and fatty. ?70 to 75% of food eaten should be vegetables and fruit. ?Three meals at set times with snacks allowed between meals, but they must be fruit or vegetables. ?Aim to eat over a 12 hour period , example 7 am to 7 pm, and STOP after  your last meal of the day. ?Drink water,generally about 64 ounces per day, no other drink is as healthy. Fruit juice is best enjoyed in a healthy way, by EATING the fruit. ? ?Thanks for choosing [facility], we consider it a privelige to serve you. ? ?

## 2022-01-29 NOTE — Assessment & Plan Note (Signed)
Last vitamin D ?Lab Results  ?Component Value Date  ? VD25OH 36 08/25/2017  ?On vitamin D 1000 units daily ?Check labs today ?

## 2022-01-29 NOTE — Assessment & Plan Note (Signed)
Had trauma to her left era few weeks ago ?Dried blood noted in her left ear. ?Denies trouble with hearing, ear pain, tympanic membrane was intact on examination, no sign of infection noted. ?Monitor for now  ?

## 2022-01-29 NOTE — Assessment & Plan Note (Signed)
Annual exam as documented.  ?Counseling done include healthy lifestyle involving committing to 150 minutes of exercise per week, heart healthy diet, and attaining healthy weight. The importance of adequate sleep also discussed.  ?Regular use of seat belt and home safety were also discussed . ?Changes in health habits are decided on by patient with goals and time frames set for achieving them. ?Immunization and cancer screening  needs are specifically addressed at this visit.   ?

## 2022-01-29 NOTE — Assessment & Plan Note (Signed)
BP Readings from Last 3 Encounters:  ?01/29/22 125/74  ?10/29/21 (!) 163/91  ?11/09/17 114/68  ?Chronic condition well-controlled on lisinopril 5 mg daily ?Continue current medication ?DASH diet advised engage in regular daily exercises at least 150 minutes weekly. ?CMP plus EGFR at next visit ?

## 2022-01-29 NOTE — Progress Notes (Signed)
? ?Complete physical exam ? ?Patient: Audrey Ross   DOB: 06/01/1951   71 y.o. Female  MRN: [id number] ? ?Subjective:  ?  ?Chief Complaint  ?Patient presents with  ? Annual Exam  ?  CPE  ? ? ?Audrey Ross is a 71 y.o. female with past medical history of essential hypertension, chronic GERD, controlled type 2 diabetes, hyperlipidemia, GAD, vitamin B12 deficiency presents today for a complete physical exam. She reports consuming a low fat and low sodium diet. She does not exercise but does a lot of work in the yard taking care of the animal, does mowing a lot .  She generally feels well. She reports sleeping well. She does have additional problems to discuss today.  ? ?Wears prescription glasses and states that she will make an appointment with opthalmology since she is due for a visit. Does not see a dentist.  Patient encouraged to visit the dentist at least twice yearly for routine examinations ? ?She had colonoscopy 9 years ago she would like to have Cologuard test done.  Denies stomach pain bloody stool, dark tarry colored stool. ? ? ?Type 2 diabetes she has not been taking faxiga because she does not know why to take the medicine. She was taking the medication and stopped after she ran out of her prscription .  She has been taking metformin 500 mg daily.  Denies hypoglycemia, polyuria, polydipsia, polyphagia. need to continue taking [place] discussed with patient she verbalized understanding. ? ?Laural Roes CMA present as chaperone  ?Most recent fall risk assessment: ? ?  01/29/2022  ?  9:11 AM  ?Fall Risk   ?Falls in the past year? 0  ?Number falls in past yr: 0  ?Injury with Fall? 0  ?Risk for fall due to : No Fall Risks  ?Follow up Falls evaluation completed  ? ?  ?Most recent depression screenings: ? ?  01/29/2022  ?  9:11 AM 10/29/2021  ?  1:28 PM  ?PHQ 2/9 Scores  ?PHQ - 2 Score 0 0  ? ? ? ? ? ? ?Patient Care Team: ?Renee Rival, FNP as PCP - General (Nurse Practitioner)  ? ?Outpatient Medications Prior to  Visit  ?Medication Sig  ? aspirin EC 81 MG tablet Take 81 mg by mouth daily. Swallow whole.  ? cholecalciferol (VITAMIN D) 1000 units tablet Take 1,000 Units by mouth daily.  ? citalopram (CELEXA) 20 MG tablet Take 1 tablet (20 mg total) by mouth daily.  ? Cyanocobalamin (B-12 PO) Take by mouth. Once a day  ? FOLIC ACID PO Take by mouth. Once a day  ? lisinopril (ZESTRIL) 5 MG tablet Take 1 tablet (5 mg total) by mouth daily.  ? lovastatin (MEVACOR) 40 MG tablet Take 1 tablet (40 mg total) by mouth daily at 8 pm.  ? meloxicam (MOBIC) 15 MG tablet Take 1 tablet (15 mg total) by mouth daily.  ? metFORMIN (GLUCOPHAGE-XR) 500 MG 24 hr tablet Take 1 tablet (500 mg total) by mouth daily with breakfast.  ? Multiple Vitamins-Minerals (MULTIPLE VITAMINS/WOMENS PO) Take by mouth. Once a day  ? pantoprazole (PROTONIX) 40 MG tablet Take 1 tablet (40 mg total) by mouth daily.  ? [DISCONTINUED] dapagliflozin propanediol (FARXIGA) 10 MG TABS tablet Take 1 tablet (10 mg total) by mouth daily. (Patient not taking: Reported on 01/29/2022)  ? ?No facility-administered medications prior to visit.  ? ? ?Review of Systems  ?Constitutional: Negative.   ?HENT: Negative.    ?Eyes: Negative.   ?Respiratory: Negative.    ?  Cardiovascular: Negative.   ?Gastrointestinal: Negative.   ?Genitourinary: Negative.   ?Musculoskeletal: Negative.   ?Skin: Negative.   ?Neurological: Negative.   ?Endo/Heme/Allergies: Negative.   ?Psychiatric/Behavioral: Negative.    ? ? ? ? ?   ?Objective:  ? ?  ?BP 125/74 (BP Location: Right Arm, Patient Position: Sitting, Cuff Size: Large)   Pulse 70   Ht '5\' 9"'$  (1.753 m)   Wt 202 lb (91.6 kg)   SpO2 98%   BMI 29.83 kg/m?  ? ? ?Physical Exam ?Constitutional:   ?   General: She is not in acute distress. ?   Appearance: She is not ill-appearing, toxic-appearing or diaphoretic.  ?HENT:  ?   Head: Normocephalic and atraumatic.  ?   Right Ear: Tympanic membrane, ear canal and external ear normal. There is no impacted  cerumen.  ?   Left Ear: Tympanic membrane, ear canal and external ear normal. There is no impacted cerumen.  ?   Ears:  ?   Comments: Dry blood noted in the left ear ?   Nose: Nose normal. No congestion or rhinorrhea.  ?   Mouth/Throat:  ?   Mouth: Mucous membranes are moist.  ?   Pharynx: Oropharynx is clear. No oropharyngeal exudate.  ?Eyes:  ?   General: No scleral icterus.    ?   Right eye: No discharge.     ?   Left eye: No discharge.  ?   Extraocular Movements: Extraocular movements intact.  ?   Pupils: Pupils are equal, round, and reactive to light.  ?Neck:  ?   Vascular: No carotid bruit.  ?Cardiovascular:  ?   Rate and Rhythm: Normal rate and regular rhythm.  ?   Pulses: Normal pulses.  ?   Heart sounds: Normal heart sounds. No murmur heard. ?  No friction rub. No gallop.  ?Pulmonary:  ?   Effort: Pulmonary effort is normal. No respiratory distress.  ?   Breath sounds: Normal breath sounds. No stridor. No wheezing, rhonchi or rales.  ?Chest:  ?   Chest wall: No mass, lacerations, deformity, swelling, tenderness or edema.  ?Breasts: ?   Tanner Score is 5.  ?   Breasts are symmetrical.  ?   Right: Normal. No swelling, bleeding, inverted nipple, mass, nipple discharge, skin change or tenderness.  ?   Left: Normal. No swelling, bleeding, inverted nipple, mass, nipple discharge, skin change or tenderness.  ?Abdominal:  ?   General: There is no distension.  ?   Palpations: Abdomen is soft. There is no mass.  ?   Tenderness: There is no abdominal tenderness. There is no right CVA tenderness, left CVA tenderness, guarding or rebound.  ?   Hernia: No hernia is present.  ?Musculoskeletal:     ?   General: No swelling, tenderness, deformity or signs of injury.  ?   Cervical back: Normal range of motion and neck supple. No rigidity or tenderness.  ?   Right lower leg: No edema.  ?   Left lower leg: No edema.  ?Lymphadenopathy:  ?   Cervical: No cervical adenopathy.  ?   Upper Body:  ?   Right upper body: No  supraclavicular, axillary or pectoral adenopathy.  ?   Left upper body: No supraclavicular, axillary or pectoral adenopathy.  ?Skin: ?   General: Skin is warm and dry.  ?   Capillary Refill: Capillary refill takes less than 2 seconds.  ?   Coloration: Skin is not jaundiced or pale.  ?  Findings: No bruising, erythema, lesion or rash.  ?Neurological:  ?   Mental Status: She is alert and oriented to person, place, and time.  ?   Cranial Nerves: No cranial nerve deficit.  ?   Sensory: No sensory deficit.  ?   Motor: No weakness.  ?   Coordination: Coordination normal.  ?   Gait: Gait normal.  ?   Deep Tendon Reflexes: Reflexes normal.  ?Psychiatric:     ?   Mood and Affect: Mood normal.     ?   Behavior: Behavior normal.     ?   Thought Content: Thought content normal.     ?   Judgment: Judgment normal.  ?  ? ?No results found for any visits on 01/29/22. ? ?   ?Assessment & Plan:  ?  ?Routine Health Maintenance and Physical Exam ? ?Immunization History  ?Administered Date(s) Administered  ? Influenza, High Dose Seasonal PF 08/11/2018  ? Influenza,inj,Quad PF,6+ Mos 06/24/2017  ? Influenza-Unspecified 08/23/2013, 06/24/2017, 08/14/2018  ? Pneumococcal Conjugate-13 10/23/2014  ? Pneumococcal Polysaccharide-23 06/18/2009  ? Td 05/04/2013  ? ? ?Health Maintenance  ?Topic Date Due  ? Pneumonia Vaccine 64+ Years old (24) 10/24/2015  ? MAMMOGRAM  06/26/2016  ? OPHTHALMOLOGY EXAM  05/15/2018  ? HEMOGLOBIN A1C  05/04/2022  ? INFLUENZA VACCINE  05/06/2022  ? COLONOSCOPY (Pts 45-87yr Insurance coverage will need to be confirmed)  10/06/2022  ? FOOT EXAM  01/30/2023  ? TETANUS/TDAP  05/05/2023  ? DEXA SCAN  Completed  ? Hepatitis C Screening  Completed  ? HPV VACCINES  Aged Out  ? COVID-19 Vaccine  Discontinued  ? Zoster Vaccines- Shingrix  Discontinued  ? ? ?Discussed health benefits of physical activity, and encouraged her to engage in regular exercise appropriate for her age and condition. ? ?Problem List Items Addressed This  Visit   ? ?  ? Cardiovascular and Mediastinum  ? Essential hypertension  ?  BP Readings from Last 3 Encounters:  ?01/29/22 125/74  ?10/29/21 (!) 163/91  ?11/09/17 114/68  ?Chronic condition well-controlled on lisinopril 5 m

## 2022-01-29 NOTE — Assessment & Plan Note (Signed)
On vitamin D 12 1000 mcg daily ?Check labs today ?Lab Results  ?Component Value Date  ? [id number] 487 08/25/2017  ? ?

## 2022-01-29 NOTE — Assessment & Plan Note (Signed)
Lab Results  ?Component Value Date  ? HGBA1C 6.1 (H) 11/04/2021  ?She had stopped taking [place] when she ran out of prescription ?Farxiga 10 mg daily reordered today, continue with metformin 500 mg daily. ?Need for both medications discussed with patient ?Avoid sugar sweets soda exercises regularly 150 minutes a week ?Diabetic eye exam scheduled today ?Diabetic foot exam completed today ?

## 2022-01-30 LAB — CBC WITH DIFFERENTIAL/PLATELET
Basophils Absolute: 0.1 10*3/uL (ref 0.0–0.2)
Basos: 1 %
EOS (ABSOLUTE): 0.2 10*3/uL (ref 0.0–0.4)
Eos: 3 %
Hematocrit: 41.7 % (ref 34.0–46.6)
Hemoglobin: 14.1 g/dL (ref 11.1–15.9)
Immature Grans (Abs): 0 10*3/uL (ref 0.0–0.1)
Immature Granulocytes: 0 %
Lymphocytes Absolute: 2.6 10*3/uL (ref 0.7–3.1)
Lymphs: 51 %
MCH: 30.5 pg (ref 26.6–33.0)
MCHC: 33.8 g/dL (ref 31.5–35.7)
MCV: 90 fL (ref 79–97)
Monocytes Absolute: 0.4 10*3/uL (ref 0.1–0.9)
Monocytes: 7 %
Neutrophils Absolute: 2 10*3/uL (ref 1.4–7.0)
Neutrophils: 38 %
Platelets: 238 10*3/uL (ref 150–450)
RBC: 4.63 x10E6/uL (ref 3.77–5.28)
RDW: 12.2 % (ref 11.7–15.4)
WBC: 5.1 10*3/uL (ref 3.4–10.8)

## 2022-01-30 LAB — B12 AND FOLATE PANEL
Folate: >20 ng/mL (ref >3.0)
Vitamin B-12: 911 pg/mL (ref 232–1245)

## 2022-01-30 LAB — TSH+FREE T4
Free T4: 0.87 ng/dL (ref 0.82–1.77)
TSH: 2.59 u[IU]/mL (ref 0.450–4.500)

## 2022-01-30 LAB — VITAMIN D 25 HYDROXY (VIT D DEFICIENCY, FRACTURES): Vit D, 25-Hydroxy: 66.2 ng/mL (ref 30.0–100.0)

## 2022-01-30 NOTE — Progress Notes (Signed)
All labs are normal. Continue current medications

## 2022-01-31 ENCOUNTER — Encounter: Payer: Self-pay | Admitting: Nurse Practitioner

## 2022-02-03 NOTE — Telephone Encounter (Signed)
Please advise 

## 2022-02-17 ENCOUNTER — Inpatient Hospital Stay: Admission: RE | Admit: 2022-02-17 | Payer: Medicare HMO | Source: Ambulatory Visit

## 2022-02-24 ENCOUNTER — Encounter: Payer: Self-pay | Admitting: Nurse Practitioner

## 2022-02-25 ENCOUNTER — Other Ambulatory Visit: Payer: Self-pay | Admitting: Nurse Practitioner

## 2022-02-25 ENCOUNTER — Ambulatory Visit: Payer: Medicare HMO

## 2022-02-25 DIAGNOSIS — K30 Functional dyspepsia: Secondary | ICD-10-CM

## 2022-03-04 ENCOUNTER — Other Ambulatory Visit: Payer: Self-pay | Admitting: Nurse Practitioner

## 2022-03-04 DIAGNOSIS — K30 Functional dyspepsia: Secondary | ICD-10-CM

## 2022-03-04 DIAGNOSIS — Z1211 Encounter for screening for malignant neoplasm of colon: Secondary | ICD-10-CM | POA: Diagnosis not present

## 2022-03-05 ENCOUNTER — Ambulatory Visit
Admission: RE | Admit: 2022-03-05 | Discharge: 2022-03-05 | Disposition: A | Discharge status: Home / Self Care | Payer: Medicare HMO | Source: Ambulatory Visit | Attending: Nurse Practitioner | Admitting: Nurse Practitioner

## 2022-03-05 DIAGNOSIS — Z1231 Encounter for screening mammogram for malignant neoplasm of breast: Secondary | ICD-10-CM | POA: Diagnosis not present

## 2022-03-07 ENCOUNTER — Telehealth: Payer: Self-pay

## 2022-03-07 NOTE — Telephone Encounter (Signed)
Spoke with pt

## 2022-03-07 NOTE — Telephone Encounter (Signed)
Patient returning call.

## 2022-03-07 NOTE — Progress Notes (Signed)
Normal mammogram repeat in 1 year

## 2022-03-12 LAB — COLOGUARD: COLOGUARD: NEGATIVE

## 2022-03-13 NOTE — Progress Notes (Signed)
Normal test, repeat in 3 years

## 2022-04-20 ENCOUNTER — Other Ambulatory Visit: Payer: Self-pay | Admitting: Nurse Practitioner

## 2022-04-29 ENCOUNTER — Other Ambulatory Visit: Payer: Self-pay | Admitting: Nurse Practitioner

## 2022-04-29 ENCOUNTER — Encounter: Payer: Self-pay | Admitting: Nurse Practitioner

## 2022-04-29 MED ORDER — MELOXICAM 15 MG PO TABS
15.0000 mg | ORAL_TABLET | Freq: Every day | ORAL | 0 refills | Status: DC
Start: 2022-04-29 — End: 2022-11-26

## 2022-04-29 NOTE — Telephone Encounter (Signed)
Please advise 

## 2022-05-02 ENCOUNTER — Other Ambulatory Visit: Payer: Self-pay | Admitting: Nurse Practitioner

## 2022-05-02 DIAGNOSIS — K30 Functional dyspepsia: Secondary | ICD-10-CM

## 2022-05-10 ENCOUNTER — Encounter: Payer: Self-pay | Admitting: Nurse Practitioner

## 2022-05-18 ENCOUNTER — Other Ambulatory Visit: Payer: Self-pay | Admitting: Nurse Practitioner

## 2022-05-27 ENCOUNTER — Other Ambulatory Visit: Payer: Self-pay | Admitting: Nurse Practitioner

## 2022-05-27 DIAGNOSIS — K30 Functional dyspepsia: Secondary | ICD-10-CM

## 2022-06-03 ENCOUNTER — Ambulatory Visit: Payer: Medicare HMO | Admitting: Nurse Practitioner

## 2022-06-04 ENCOUNTER — Encounter: Payer: Self-pay | Admitting: Nurse Practitioner

## 2022-06-11 ENCOUNTER — Ambulatory Visit: Payer: Medicare HMO | Admitting: Nurse Practitioner

## 2022-06-11 ENCOUNTER — Ambulatory Visit: Payer: Medicare HMO | Admitting: Internal Medicine

## 2022-06-16 ENCOUNTER — Other Ambulatory Visit: Payer: Self-pay | Admitting: Nurse Practitioner

## 2022-06-17 ENCOUNTER — Ambulatory Visit: Payer: Medicare HMO | Admitting: Internal Medicine

## 2022-06-17 ENCOUNTER — Encounter: Payer: Self-pay | Admitting: Internal Medicine

## 2022-06-17 ENCOUNTER — Encounter: Payer: Self-pay | Admitting: Nurse Practitioner

## 2022-06-27 ENCOUNTER — Ambulatory Visit (INDEPENDENT_AMBULATORY_CARE_PROVIDER_SITE_OTHER): Payer: Medicare HMO | Admitting: Internal Medicine

## 2022-06-27 ENCOUNTER — Encounter: Payer: Self-pay | Admitting: Internal Medicine

## 2022-06-27 VITALS — BP 119/76 | HR 74 | Ht 69.0 in | Wt 198.0 lb

## 2022-06-27 DIAGNOSIS — Z0001 Encounter for general adult medical examination with abnormal findings: Secondary | ICD-10-CM

## 2022-06-27 DIAGNOSIS — Z0000 Encounter for general adult medical examination without abnormal findings: Secondary | ICD-10-CM | POA: Diagnosis not present

## 2022-06-27 DIAGNOSIS — E785 Hyperlipidemia, unspecified: Secondary | ICD-10-CM

## 2022-06-27 DIAGNOSIS — Z2821 Immunization not carried out because of patient refusal: Secondary | ICD-10-CM

## 2022-06-27 DIAGNOSIS — E559 Vitamin D deficiency, unspecified: Secondary | ICD-10-CM | POA: Diagnosis not present

## 2022-06-27 DIAGNOSIS — F411 Generalized anxiety disorder: Secondary | ICD-10-CM

## 2022-06-27 DIAGNOSIS — E538 Deficiency of other specified B group vitamins: Secondary | ICD-10-CM | POA: Diagnosis not present

## 2022-06-27 DIAGNOSIS — R5383 Other fatigue: Secondary | ICD-10-CM

## 2022-06-27 DIAGNOSIS — K219 Gastro-esophageal reflux disease without esophagitis: Secondary | ICD-10-CM | POA: Diagnosis not present

## 2022-06-27 DIAGNOSIS — I10 Essential (primary) hypertension: Secondary | ICD-10-CM | POA: Diagnosis not present

## 2022-06-27 DIAGNOSIS — R69 Illness, unspecified: Secondary | ICD-10-CM | POA: Diagnosis not present

## 2022-06-27 DIAGNOSIS — M159 Polyosteoarthritis, unspecified: Secondary | ICD-10-CM | POA: Diagnosis not present

## 2022-06-27 DIAGNOSIS — E118 Type 2 diabetes mellitus with unspecified complications: Secondary | ICD-10-CM

## 2022-06-27 MED ORDER — OMEPRAZOLE 20 MG PO CPDR: 20.0000 mg | DELAYED_RELEASE_CAPSULE | Freq: Every day | ORAL | 3 refills | Status: DC

## 2022-06-27 NOTE — Patient Instructions (Signed)
It was a pleasure to see you today.  Thank you for giving [place] the opportunity to be involved in your care.  Below is a brief recap of your visit and next steps.  We will plan to see you again in 6-8 weeks  Summary I have ordered labs today Omeprazole has been prescribed for GERD.   Next steps Follow up in 6-8 weeks I will notify you lab results

## 2022-06-27 NOTE — Assessment & Plan Note (Signed)
BP 119/76 today.  Well-controlled on lisinopril 5 mg daily.  No changes today.

## 2022-06-27 NOTE — Assessment & Plan Note (Signed)
History of chronic GERD.  Symptoms previously resolved with Protonix 40 mg daily.  More recently, her symptoms have persisted despite taking Protonix as prescribed.  She denies red flag symptoms currently. -Switch omeprazole 20 mg daily today.   -Follow-up in 6-8 weeks for reassessment.

## 2022-06-27 NOTE — Assessment & Plan Note (Signed)
She endorses recent fatigue today.  She states that she has been under more stress than usual and has had more responsibilities at home than she typically does. -Labs ordered today, including TSH and iron studies

## 2022-06-27 NOTE — Assessment & Plan Note (Signed)
Presenting today for follow-up -Basic labs ordered -Outstanding diabetes related healthcare maintenance items addressed (HbA1c, urine microalbumin/creatinine ratio) -Declined influenza and pneumococcal vaccines today -Recommended ophthalmology follow-up

## 2022-06-27 NOTE — Progress Notes (Signed)
Established Patient Office Visit  Subjective   Patient ID: Audrey Ross, female    DOB: 01-Apr-1951  Age: 71 y.o. MRN: [id number]  Chief Complaint  Patient presents with   Follow-up    Having problems with heartburn.   Audrey Ross is a 71 year old woman returning to care today.  She has a past medical history significant for essential hypertension, GERD, T2DM, HLD, anxiety, and B12 deficiency.  She was last seen at [facility] in April for her annual exam.  There have been no acute interval events.  Today Audrey Ross is feeling well.  She is accompanied by her husband.  She endorses persistent reflux symptoms today.  She is prescribed pantoprazole 40 milligrams daily.  This typically relieves her symptoms, however more recently her symptoms have persisted.  The heartburn she describes is the same in quality and location as heartburn she has previously experienced.  She denies radiation of pain to her neck, jaw, back, shoulder, or arm.  No red flag symptoms present.  Audrey Ross also endorses recent fatigue, noting that she has been under more stress than usual lately and has had more responsibilities.  She is otherwise asymptomatic.  Acute concerns, chronic medical conditions, and outstanding preventative healthcare maintenance items discussed today are individually addressed in A/P below.  Past Medical History:  Diagnosis Date   Allergy    hay fever   Anxiety    Arthritis    R knee. R foot, L shoudler   Cancer ([place])    hysterectomy 2009 cervical   Diabetes mellitus without complication (HCC)    GERD (gastroesophageal reflux disease)    Hyperlipidemia    Hypertension    Sleep apnea    no longer uses   Past Surgical History:  Procedure Laterality Date   ABDOMINAL HYSTERECTOMY     2009   CARPAL TUNNEL RELEASE     both hands   HEMORRHOID SURGERY     in high school   STAPEDES SURGERY     had 3 surgeries   TONSILLECTOMY     TUBAL LIGATION     Social History   Tobacco Use   Smoking status: Never    Smokeless tobacco: Never  Substance Use Topics   Alcohol use: No   Drug use: No   Family History  Problem Relation Age of Onset   Arthritis Mother        "crippled"   Diabetes Mother    Hearing loss Mother    Heart disease Mother    Hearing loss Father    Vision loss Father    Cancer Brother        bladder   Diabetes Brother    Hyperlipidemia Brother    Early death Maternal Grandfather    Breast cancer Neg Hx    Lung cancer Neg Hx    Colon cancer Neg Hx    Allergies  Allergen Reactions   Zoster Vaccine Live Swelling   Review of Systems  Constitutional:  Positive for malaise/fatigue.  Gastrointestinal:  Positive for heartburn.  All other systems reviewed and are negative.    Objective:     BP 119/76 (BP Location: Right Arm, Patient Position: Sitting)   Pulse 74   Ht _0  (1.753 m)   Wt 198 lb (89.8 kg)   SpO2 96%   BMI 29.24 kg/m  BP Readings from Last 3 Encounters:  06/27/22 119/76  01/29/22 125/74  10/29/21 (!) 163/91   Physical Exam Constitutional:      General:  She is not in acute distress.    Appearance: Normal appearance. She is normal weight. She is not toxic-appearing.  HENT:     Head: Normocephalic and atraumatic.     Right Ear: External ear normal.     Left Ear: External ear normal.     Nose: Nose normal. No congestion or rhinorrhea.     Mouth/Throat:     Mouth: Mucous membranes are moist.     Pharynx: Oropharynx is clear. No oropharyngeal exudate or posterior oropharyngeal erythema.  Eyes:     General: No scleral icterus.    Conjunctiva/sclera: Conjunctivae normal.     Pupils: Pupils are equal, round, and reactive to light.  Cardiovascular:     Rate and Rhythm: Normal rate and regular rhythm.     Pulses: Normal pulses.     Heart sounds: Normal heart sounds. No murmur heard.    No friction rub. No gallop.  Pulmonary:     Effort: Pulmonary effort is normal. No respiratory distress.     Breath sounds: Normal breath sounds. No wheezing,  rhonchi or rales.  Abdominal:     General: Abdomen is flat. Bowel sounds are normal. There is no distension.     Palpations: Abdomen is soft.     Tenderness: There is no abdominal tenderness.  Musculoskeletal:        General: No swelling. Normal range of motion.     Cervical back: Normal range of motion.     Right lower leg: No edema.     Left lower leg: No edema.  Lymphadenopathy:     Cervical: No cervical adenopathy.  Skin:    General: Skin is warm and dry.     Capillary Refill: Capillary refill takes less than 2 seconds.  Neurological:     General: No focal deficit present.     Mental Status: She is alert and oriented to person, place, and time.     Motor: No weakness.  Psychiatric:        Mood and Affect: Mood normal.        Behavior: Behavior normal.    Last CBC Lab Results  Component Value Date   WBC 5.1 01/29/2022   HGB 14.1 01/29/2022   HCT 41.7 01/29/2022   MCV 90 01/29/2022   MCH 30.5 01/29/2022   RDW 12.2 01/29/2022   PLT 238 08/65/7846   Last metabolic panel Lab Results  Component Value Date   GLUCOSE 116 (H) 11/04/2021   NA 141 11/04/2021   K 4.2 11/04/2021   CL 105 11/04/2021   CO2 25 11/04/2021   BUN 15 11/04/2021   CREATININE 0.78 11/04/2021   EGFR 81 11/04/2021   CALCIUM 9.4 11/04/2021   PROT 6.1 11/04/2021   ALBUMIN 4.3 11/04/2021   LABGLOB 1.8 11/04/2021   AGRATIO 2.4 (H) 11/04/2021   BILITOT 0.4 11/04/2021   ALKPHOS 59 11/04/2021   AST 22 11/04/2021   ALT 23 11/04/2021   Last lipids Lab Results  Component Value Date   CHOL 164 11/04/2021   HDL 72 11/04/2021   LDLCALC 79 11/04/2021   TRIG 65 11/04/2021   CHOLHDL 2.3 11/04/2021   Last hemoglobin A1c Lab Results  Component Value Date   HGBA1C 6.1 (H) 11/04/2021   Last thyroid functions Lab Results  Component Value Date   TSH 2.590 01/29/2022   Last vitamin D Lab Results  Component Value Date   VD25OH 66.2 01/29/2022   Last vitamin B12 and Folate Lab Results  Component  Value  Date   VITAMINB12 911 01/29/2022   FOLATE >20.0 01/29/2022    The 10-year ASCVD risk score (Arnett DK, et al., 2019) is: 25.8%    Assessment & Plan:   Problem List Items Addressed This Visit       Cardiovascular and Mediastinum   Essential hypertension    BP 119/76 today.  Well-controlled on lisinopril 5 mg daily.  No changes today.        Digestive   Chronic GERD    History of chronic GERD.  Symptoms previously resolved with Protonix 40 mg daily.  More recently, her symptoms have persisted despite taking Protonix as prescribed.  She denies red flag symptoms currently. -Switch omeprazole 20 mg daily today.   -Follow-up in 6-8 weeks for reassessment.        Endocrine   Controlled diabetes mellitus type 2 with complications (HCC)    Last A1c 6.1 in January.  She is currently prescribed metformin and Farxiga.  Denies symptoms of polyuria/polydipsia. -Repeat A1c today -Recommended that she follow-up with her ophthalmologist for routine diabetic eye care. -Urine microalbumin/creatinine ratio ordered today        Musculoskeletal and Integument   Generalized osteoarthritis of multiple sites    She is currently taking Mobic 15 mg daily.  I expressed concern over chronic NSAID use, particularly in the setting of her current GI symptoms. -I recommended routine use of Tylenol as needed for pain in addition to Voltaren gel.  She will try this.  Follow-up in 6-8 weeks.        Other   HLD (hyperlipidemia)    Currently prescribed lovastatin 40 mg daily.  Last lipid panel completed January 2023.  10-year ASCVD risk score today is 25.8%.  High intensity statin therapy indicated.  Plan to further discuss this at follow-up. -Repeat lipid panel ordered today      GAD (generalized anxiety disorder)    Symptoms well controlled on Celexa.  No changes today.      Vitamin D deficiency   Preventative health care    Presenting today for follow-up -Basic labs ordered -Outstanding  diabetes related healthcare maintenance items addressed (HbA1c, urine microalbumin/creatinine ratio) -Declined influenza and pneumococcal vaccines today -Recommended ophthalmology follow-up      Fatigue    She endorses recent fatigue today.  She states that she has been under more stress than usual and has had more responsibilities at home than she typically does. -Labs ordered today, including TSH and iron studies      Return in about 6 weeks (around 08/08/2022).    Johnette Abraham, MD

## 2022-06-27 NOTE — Assessment & Plan Note (Signed)
She is currently taking Mobic 15 mg daily.  I expressed concern over chronic NSAID use, particularly in the setting of her current GI symptoms. -I recommended routine use of Tylenol as needed for pain in addition to Voltaren gel.  She will try this.  Follow-up in 6-8 weeks.

## 2022-06-27 NOTE — Assessment & Plan Note (Addendum)
Currently prescribed lovastatin 40 mg daily.  Last lipid panel completed January 2023.  10-year ASCVD risk score today is 25.8%.  High intensity statin therapy indicated.  Plan to further discuss this at follow-up. -Repeat lipid panel ordered today

## 2022-06-27 NOTE — Assessment & Plan Note (Signed)
Symptoms well controlled on Celexa.  No changes today.

## 2022-06-27 NOTE — Assessment & Plan Note (Signed)
Last A1c 6.1 in January.  She is currently prescribed metformin and Farxiga.  Denies symptoms of polyuria/polydipsia. -Repeat A1c today -Recommended that she follow-up with her ophthalmologist for routine diabetic eye care. -Urine microalbumin/creatinine ratio ordered today

## 2022-06-29 LAB — CBC WITH DIFFERENTIAL/PLATELET
Basophils Absolute: 0.1 10*3/uL (ref 0.0–0.2)
Basos: 1 %
EOS (ABSOLUTE): 0.2 10*3/uL (ref 0.0–0.4)
Eos: 3 %
Hematocrit: 41 % (ref 34.0–46.6)
Hemoglobin: 13.9 g/dL (ref 11.1–15.9)
Immature Grans (Abs): 0 10*3/uL (ref 0.0–0.1)
Immature Granulocytes: 0 %
Lymphocytes Absolute: 2.8 10*3/uL (ref 0.7–3.1)
Lymphs: 49 %
MCH: 30.4 pg (ref 26.6–33.0)
MCHC: 33.9 g/dL (ref 31.5–35.7)
MCV: 90 fL (ref 79–97)
Monocytes Absolute: 0.4 10*3/uL (ref 0.1–0.9)
Monocytes: 8 %
Neutrophils Absolute: 2.3 10*3/uL (ref 1.4–7.0)
Neutrophils: 39 %
Platelets: 251 10*3/uL (ref 150–450)
RBC: 4.57 x10E6/uL (ref 3.77–5.28)
RDW: 12 % (ref 11.7–15.4)
WBC: 5.9 10*3/uL (ref 3.4–10.8)

## 2022-06-29 LAB — LIPID PANEL
Chol/HDL Ratio: 2.9 ratio (ref 0.0–4.4)
Cholesterol, Total: 212 mg/dL — ABNORMAL HIGH (ref 100–199)
HDL: 73 mg/dL (ref >39)
LDL Chol Calc (NIH): 123 mg/dL — ABNORMAL HIGH (ref 0–99)
Triglycerides: 90 mg/dL (ref 0–149)
VLDL Cholesterol Cal: 16 mg/dL (ref 5–40)

## 2022-06-29 LAB — CMP14+EGFR
ALT: 17 IU/L (ref 0–32)
AST: 21 IU/L (ref 0–40)
Albumin/Globulin Ratio: 2.1 (ref 1.2–2.2)
Albumin: 4.5 g/dL (ref 3.8–4.8)
Alkaline Phosphatase: 65 IU/L (ref 44–121)
BUN/Creatinine Ratio: 23 (ref 12–28)
BUN: 18 mg/dL (ref 8–27)
Bilirubin Total: 0.3 mg/dL (ref 0.0–1.2)
CO2: 19 mmol/L — ABNORMAL LOW (ref 20–29)
Calcium: 9.9 mg/dL (ref 8.7–10.3)
Chloride: 104 mmol/L (ref 96–106)
Creatinine, Ser: 0.79 mg/dL (ref 0.57–1.00)
Globulin, Total: 2.1 g/dL (ref 1.5–4.5)
Glucose: 99 mg/dL (ref 70–99)
Potassium: 4.7 mmol/L (ref 3.5–5.2)
Sodium: 143 mmol/L (ref 134–144)
Total Protein: 6.6 g/dL (ref 6.0–8.5)
eGFR: 79 mL/min/{1.73_m2} (ref >59)

## 2022-06-29 LAB — IRON,TIBC AND FERRITIN PANEL
Ferritin: 48 ng/mL (ref 15–150)
Iron Saturation: 28 % (ref 15–55)
Iron: 85 ug/dL (ref 27–139)
Total Iron Binding Capacity: 303 ug/dL (ref 250–450)
UIBC: 218 ug/dL (ref 118–369)

## 2022-06-29 LAB — MICROALBUMIN / CREATININE URINE RATIO
Creatinine, Urine: 63.1 mg/dL
Microalb/Creat Ratio: <5 mg/g creat (ref 0–29)
Microalbumin, Urine: <3 ug/mL

## 2022-06-29 LAB — TSH+FREE T4
Free T4: 0.88 ng/dL (ref 0.82–1.77)
TSH: 2.6 u[IU]/mL (ref 0.450–4.500)

## 2022-06-29 LAB — B12 AND FOLATE PANEL
Folate: 17.8 ng/mL (ref >3.0)
Vitamin B-12: 1341 pg/mL — ABNORMAL HIGH (ref 232–1245)

## 2022-06-29 LAB — HEMOGLOBIN A1C
Est. average glucose Bld gHb Est-mCnc: 128 mg/dL
Hgb A1c MFr Bld: 6.1 % — ABNORMAL HIGH (ref 4.8–5.6)

## 2022-06-29 LAB — VITAMIN D 25 HYDROXY (VIT D DEFICIENCY, FRACTURES): Vit D, 25-Hydroxy: 46.9 ng/mL (ref 30.0–100.0)

## 2022-08-06 ENCOUNTER — Other Ambulatory Visit: Payer: Self-pay | Admitting: Nurse Practitioner

## 2022-08-08 ENCOUNTER — Ambulatory Visit: Payer: Medicare HMO | Admitting: Internal Medicine

## 2022-08-11 ENCOUNTER — Encounter: Payer: Self-pay | Admitting: Internal Medicine

## 2022-08-12 ENCOUNTER — Ambulatory Visit: Payer: Medicare HMO | Admitting: Internal Medicine

## 2022-08-21 ENCOUNTER — Encounter: Payer: Self-pay | Admitting: Internal Medicine

## 2022-08-21 ENCOUNTER — Ambulatory Visit (INDEPENDENT_AMBULATORY_CARE_PROVIDER_SITE_OTHER): Payer: Medicare HMO | Admitting: Internal Medicine

## 2022-08-21 VITALS — BP 122/77 | HR 72 | Ht 69.0 in | Wt 200.4 lb

## 2022-08-21 DIAGNOSIS — R350 Frequency of micturition: Secondary | ICD-10-CM

## 2022-08-21 DIAGNOSIS — E118 Type 2 diabetes mellitus with unspecified complications: Secondary | ICD-10-CM

## 2022-08-21 DIAGNOSIS — R6889 Other general symptoms and signs: Secondary | ICD-10-CM

## 2022-08-21 DIAGNOSIS — M25511 Pain in right shoulder: Secondary | ICD-10-CM

## 2022-08-21 DIAGNOSIS — M25512 Pain in left shoulder: Secondary | ICD-10-CM

## 2022-08-21 DIAGNOSIS — R300 Dysuria: Secondary | ICD-10-CM

## 2022-08-21 DIAGNOSIS — E785 Hyperlipidemia, unspecified: Secondary | ICD-10-CM | POA: Diagnosis not present

## 2022-08-21 DIAGNOSIS — R1013 Epigastric pain: Secondary | ICD-10-CM

## 2022-08-21 LAB — POCT URINALYSIS DIP (CLINITEK)
Bilirubin, UA: NEGATIVE
Blood, UA: NEGATIVE
Glucose, UA: 500 mg/dL — AB
Ketones, POC UA: NEGATIVE mg/dL
Leukocytes, UA: NEGATIVE
Nitrite, UA: NEGATIVE
POC PROTEIN,UA: NEGATIVE
Spec Grav, UA: >=1.03 — AB (ref 1.010–1.025)
Urobilinogen, UA: 0.2 E.U./dL
pH, UA: 6.5 (ref 5.0–8.0)

## 2022-08-21 MED ORDER — ATORVASTATIN CALCIUM 40 MG PO TABS: 40.0000 mg | ORAL_TABLET | Freq: Every day | ORAL | 3 refills | Status: DC

## 2022-08-21 MED ORDER — PANTOPRAZOLE SODIUM 40 MG PO TBEC: 40.0000 mg | DELAYED_RELEASE_TABLET | Freq: Two times a day (BID) | ORAL | 2 refills | Status: DC

## 2022-08-21 NOTE — Progress Notes (Signed)
Established Patient Office Visit  Subjective   Patient ID: Audrey Ross, female    DOB: 08/15/1951  Age: 71 y.o. MRN: [id number]  Chief Complaint  Patient presents with   Follow-up    Shoulder and back soreness, stomach upset every evening after dinner, UTI has pressure   Audrey Ross returns to care today.  She is a 71 year old woman with a past medical history significant for essential hypertension, GERD, T2DM, HLD, anxiety, and B12 deficiency.  She was last seen by me on 9/22 at which time she endorsed persistent reflux symptoms and fatigue.  She was switched to omeprazole and labs were ordered to assess for metabolic causes of fatigue.  6-week follow-up was arranged.  There have been no acute interval events.  Today Audrey Ross endorses soreness in her shoulders and lumbar back.  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  She switched to taking omeprazole approximately 1 week ago, but states that her stomach feels upset every morning.  She endorses significant belching as well.  She is also concerned about increased urinary frequency and would like to be checked for UTI.  Acute concerns, chronic medical conditions, and Preventative care items discussed today are individually addressed in A/P below.  Past Medical History:  Diagnosis Date   Allergy    hay fever   Anxiety    Arthritis    R knee. R foot, L shoudler   Cancer ([place])    hysterectomy 2009 cervical   Diabetes mellitus without complication (HCC)    GERD (gastroesophageal reflux disease)    Hyperlipidemia    Hypertension    Sleep apnea    no longer uses   Past Surgical History:  Procedure Laterality Date   ABDOMINAL HYSTERECTOMY     2009   CARPAL TUNNEL RELEASE     both hands   HEMORRHOID SURGERY     in high school   STAPEDES SURGERY     had 3 surgeries   TONSILLECTOMY     TUBAL LIGATION     Social History   Tobacco Use   Smoking status: Never   Smokeless tobacco: Never   Substance Use Topics   Alcohol use: No   Drug use: No   Family History  Problem Relation Age of Onset   Arthritis Mother        "crippled"   Diabetes Mother    Hearing loss Mother    Heart disease Mother    Hearing loss Father    Vision loss Father    Cancer Brother        bladder   Diabetes Brother    Hyperlipidemia Brother    Early death Maternal Grandfather    Breast cancer Neg Hx    Lung cancer Neg Hx    Colon cancer Neg Hx    Allergies  Allergen Reactions   Zoster Vaccine Live Swelling   Review of Systems  Constitutional:  Negative for chills, fever and malaise/fatigue.  Gastrointestinal:  Positive for abdominal pain and heartburn.  Genitourinary:  Positive for frequency. Negative for dysuria, flank pain, hematuria and urgency.  Musculoskeletal:  Positive for back pain and myalgias.  All other systems reviewed and are negative.    Objective:     BP 122/77   Pulse 72   Ht _0  (1.753 m)   Wt 200 lb 6.4 oz (90.9 kg)   SpO2 97%   BMI 29.59 kg/m  BP Readings from Last  3 Encounters:  08/21/22 122/77  06/27/22 119/76  01/29/22 125/74      Physical Exam Vitals reviewed.  Constitutional:      General: She is not in acute distress.    Appearance: Normal appearance. She is not toxic-appearing.  HENT:     Head: Normocephalic and atraumatic.     Right Ear: External ear normal.     Left Ear: External ear normal.     Nose: Nose normal. No congestion or rhinorrhea.     Mouth/Throat:     Mouth: Mucous membranes are moist.     Pharynx: Oropharynx is clear. No oropharyngeal exudate or posterior oropharyngeal erythema.  Eyes:     General: No scleral icterus.    Extraocular Movements: Extraocular movements intact.     Conjunctiva/sclera: Conjunctivae normal.     Pupils: Pupils are equal, round, and reactive to light.  Cardiovascular:     Rate and Rhythm: Normal rate and regular rhythm.     Pulses: Normal pulses.     Heart sounds: Normal heart sounds. No  murmur heard.    No friction rub. No gallop.  Pulmonary:     Effort: Pulmonary effort is normal.     Breath sounds: Normal breath sounds. No wheezing, rhonchi or rales.  Abdominal:     General: Abdomen is flat. Bowel sounds are normal. There is no distension.     Palpations: Abdomen is soft.     Tenderness: There is no abdominal tenderness.  Musculoskeletal:        General: No swelling.     Cervical back: Normal range of motion.     Right lower leg: No edema.     Left lower leg: No edema.     Comments: Limited abduction of the shoulders bilaterally  Lymphadenopathy:     Cervical: No cervical adenopathy.  Skin:    General: Skin is warm and dry.     Capillary Refill: Capillary refill takes less than 2 seconds.     Coloration: Skin is not jaundiced.  Neurological:     General: No focal deficit present.     Mental Status: She is alert and oriented to person, place, and time.  Psychiatric:        Mood and Affect: Mood normal.        Behavior: Behavior normal.    Last CBC Lab Results  Component Value Date   WBC 5.9 06/27/2022   HGB 13.9 06/27/2022   HCT 41.0 06/27/2022   MCV 90 06/27/2022   MCH 30.4 06/27/2022   RDW 12.0 06/27/2022   PLT 251 02/54/2706   Last metabolic panel Lab Results  Component Value Date   GLUCOSE 99 06/27/2022   NA 143 06/27/2022   K 4.7 06/27/2022   CL 104 06/27/2022   CO2 19 (L) 06/27/2022   BUN 18 06/27/2022   CREATININE 0.79 06/27/2022   EGFR 79 06/27/2022   CALCIUM 9.9 06/27/2022   PROT 6.6 06/27/2022   ALBUMIN 4.5 06/27/2022   LABGLOB 2.1 06/27/2022   AGRATIO 2.1 06/27/2022   BILITOT 0.3 06/27/2022   ALKPHOS 65 06/27/2022   AST 21 06/27/2022   ALT 17 06/27/2022   Last lipids Lab Results  Component Value Date   CHOL 212 (H) 06/27/2022   HDL 73 06/27/2022   LDLCALC 123 (H) 06/27/2022   TRIG 90 06/27/2022   CHOLHDL 2.9 06/27/2022   Last hemoglobin A1c Lab Results  Component Value Date   HGBA1C 6.1 (H) 06/27/2022   Last  thyroid  functions Lab Results  Component Value Date   TSH 2.600 06/27/2022   Last vitamin D Lab Results  Component Value Date   VD25OH 46.9 06/27/2022   Last vitamin B12 and Folate Lab Results  Component Value Date   VITAMINB12 1,341 (H) 06/27/2022   FOLATE 17.8 06/27/2022   The 10-year ASCVD risk score (Arnett DK, et al., 2019) is: 28%    Assessment & Plan:   Problem List Items Addressed This Visit       Controlled diabetes mellitus type 2 with complications ([place])    K3K 6.1 in September.  She is currently prescribed metformin and Farxiga.  No medication changes today.  I recommended that she follow-up with her ophthalmologist for routine diabetic eye care.      HLD (hyperlipidemia) - Primary    Lipid panel updated in late September.  Total cholesterol 212, LDL 123.  She is currently prescribed lovastatin 40 mg daily.  Her 10-year ASCVD risk score is 28%.  High intensity statin therapy is indicated. -Through shared decision making she is in agreement with starting atorvastatin 40 mg daily and discontinue lovastatin.  We will follow-up in 1 month for reassessment.      Dyspepsia    She endorses symptoms today that seem most consistent with dyspepsia.  I have prescribed Protonix 40 mg twice daily and discontinued omeprazole.  We we will follow-up in 1 month for reassessment.  If her symptoms persist at that time, I will place a referral to gastroenterology.      Increased urinary frequency    She endorses recent increased urinary frequency and is concerned for UTI, requesting to be checked today.  She denies dysuria, hematuria, and lower abdominal discomfort. -POC UA ordered today      Bilateral shoulder pain    She endorses bilateral shoulder discomfort today and has pain with abduction on exam.  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  Tylenol and meloxicam are helpful with symptom relief. -I have provided her with home PT exercises today  for her shoulders.  She should continue use of Tylenol and meloxicam as needed.  We will follow-up in 1 month for reassessment.      Return in about 4 weeks (around 09/18/2022).    Johnette Abraham, MD

## 2022-08-21 NOTE — Patient Instructions (Signed)
It was a pleasure to see you today.  Thank you for giving [place] the opportunity to be involved in your care.  Below is a brief recap of your visit and next steps.  We will plan to see you again in 1 month.  Summary We will switch back to protonix today. I have prescribed 40 mg two times daily I have also prescribed atorvastatin to replace lovastatin We will check your urine today I am fine with as needed use of meloxicam for pain relief. Please see the attached shoulder and back exercises as well.

## 2022-08-21 NOTE — Assessment & Plan Note (Signed)
Lipid panel updated in late September.  Total cholesterol 212, LDL 123.  She is currently prescribed lovastatin 40 mg daily.  Her 10-year ASCVD risk score is 28%.  High intensity statin therapy is indicated. -Through shared decision making she is in agreement with starting atorvastatin 40 mg daily and discontinue lovastatin.  We will follow-up in 1 month for reassessment.

## 2022-08-21 NOTE — Assessment & Plan Note (Signed)
She endorses recent increased urinary frequency and is concerned for UTI, requesting to be checked today.  She denies dysuria, hematuria, and lower abdominal discomfort. -POC UA ordered today

## 2022-08-21 NOTE — Assessment & Plan Note (Signed)
She endorses bilateral shoulder discomfort today and has pain with abduction on exam.  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  Tylenol and meloxicam are helpful with symptom relief. -I have provided her with home PT exercises today for her shoulders.  She should continue use of Tylenol and meloxicam as needed.  We will follow-up in 1 month for reassessment.

## 2022-08-21 NOTE — Assessment & Plan Note (Signed)
A1c 6.1 in September.  She is currently prescribed metformin and Farxiga.  No medication changes today.  I recommended that she follow-up with her ophthalmologist for routine diabetic eye care.

## 2022-08-21 NOTE — Assessment & Plan Note (Signed)
She endorses symptoms today that seem most consistent with dyspepsia.  I have prescribed Protonix 40 mg twice daily and discontinued omeprazole.  We we will follow-up in 1 month for reassessment.  If her symptoms persist at that time, I will place a referral to gastroenterology.

## 2022-09-03 ENCOUNTER — Other Ambulatory Visit: Payer: Self-pay | Admitting: Internal Medicine

## 2022-09-10 DIAGNOSIS — R051 Acute cough: Secondary | ICD-10-CM | POA: Diagnosis not present

## 2022-09-10 DIAGNOSIS — E669 Obesity, unspecified: Secondary | ICD-10-CM | POA: Diagnosis not present

## 2022-09-10 DIAGNOSIS — Z6830 Body mass index (BMI) 30.0-30.9, adult: Secondary | ICD-10-CM | POA: Diagnosis not present

## 2022-09-17 ENCOUNTER — Other Ambulatory Visit: Payer: Self-pay | Admitting: Nurse Practitioner

## 2022-09-22 DIAGNOSIS — E663 Overweight: Secondary | ICD-10-CM | POA: Diagnosis not present

## 2022-09-22 DIAGNOSIS — Z6827 Body mass index (BMI) 27.0-27.9, adult: Secondary | ICD-10-CM | POA: Diagnosis not present

## 2022-09-22 DIAGNOSIS — R051 Acute cough: Secondary | ICD-10-CM | POA: Diagnosis not present

## 2022-09-22 DIAGNOSIS — R030 Elevated blood-pressure reading, without diagnosis of hypertension: Secondary | ICD-10-CM | POA: Diagnosis not present

## 2022-09-26 ENCOUNTER — Ambulatory Visit: Payer: Medicare HMO | Admitting: Internal Medicine

## 2022-10-12 ENCOUNTER — Encounter: Payer: Self-pay | Admitting: Internal Medicine

## 2022-10-13 ENCOUNTER — Other Ambulatory Visit: Payer: Self-pay

## 2022-10-13 DIAGNOSIS — R1013 Epigastric pain: Secondary | ICD-10-CM

## 2022-10-13 MED ORDER — CITALOPRAM HYDROBROMIDE 20 MG PO TABS: 20.0000 mg | ORAL_TABLET | Freq: Every day | ORAL | 0 refills | Status: DC

## 2022-10-13 MED ORDER — LISINOPRIL 5 MG PO TABS: 5.0000 mg | ORAL_TABLET | Freq: Every day | ORAL | 0 refills | Status: DC

## 2022-10-13 MED ORDER — PANTOPRAZOLE SODIUM 40 MG PO TBEC: 40.0000 mg | DELAYED_RELEASE_TABLET | Freq: Two times a day (BID) | ORAL | 0 refills | Status: DC

## 2022-10-20 ENCOUNTER — Ambulatory Visit (INDEPENDENT_AMBULATORY_CARE_PROVIDER_SITE_OTHER): Payer: Medicare HMO | Admitting: Internal Medicine

## 2022-10-20 ENCOUNTER — Encounter: Payer: Self-pay | Admitting: Internal Medicine

## 2022-10-20 VITALS — BP 117/74 | HR 83 | Ht 69.0 in | Wt 200.6 lb

## 2022-10-20 DIAGNOSIS — E785 Hyperlipidemia, unspecified: Secondary | ICD-10-CM

## 2022-10-20 DIAGNOSIS — Z1211 Encounter for screening for malignant neoplasm of colon: Secondary | ICD-10-CM

## 2022-10-20 NOTE — Assessment & Plan Note (Signed)
Lovastatin was discontinued and atorvastatin 40 mg daily was started at due to an elevated 10-year ASCVD risk 4 (28%).  She has not experienced any side effects with starting atorvastatin. -Repeat lipid panel ordered today

## 2022-10-20 NOTE — Assessment & Plan Note (Signed)
Negative Cologuard in May 2023.  We will update our records to reflect this.

## 2022-10-20 NOTE — Patient Instructions (Signed)
It was a pleasure to see you today.  Thank you for giving [place] the opportunity to be involved in your care.  Below is a brief recap of your visit and next steps.  We will plan to see you again in 3 months.  Summary No medication changes today We will repeat your cholesterol panel today Please let us know when you go to see your eye doctor so that we can update our records Follow up in 3 months

## 2022-10-20 NOTE — Progress Notes (Signed)
Established Patient Office Visit  Subjective   Patient ID: Audrey Ross, female    DOB: 1951/07/08  Age: 72 y.o. MRN: [id number]  Chief Complaint  Patient presents with   Hyperlipidemia    Follow up   Audrey Ross returns to care today.  She was last seen by me on 08/21/22 at which time lovastatin was discontinued and she was started on atorvastatin 40 mg daily.  Protonix 40 mg twice daily was prescribed for treatment of dyspepsia.  She additionally endorsed increased urinary frequency.  UA obtained that same day was not consistent with infection.  She is endorsing bilateral shoulder pain and was provided with home PT exercises.  There have been no acute interval events.  Today Audrey Ross reports that her urinary symptoms and shoulder pain have significantly improved.  She has not experienced any side effects with switching to atorvastatin.  She has no acute concerns to discuss today.  Past Medical History:  Diagnosis Date   Allergy    hay fever   Anxiety    Arthritis    R knee. R foot, L shoudler   Cancer [facility])    hysterectomy 2009 cervical   Diabetes mellitus without complication (HCC)    GERD (gastroesophageal reflux disease)    Hyperlipidemia    Hypertension    Preventative health care 01/29/2022   Sleep apnea    no longer uses   Past Surgical History:  Procedure Laterality Date   ABDOMINAL HYSTERECTOMY     2009   CARPAL TUNNEL RELEASE     both hands   HEMORRHOID SURGERY     in high school   STAPEDES SURGERY     had 3 surgeries   TONSILLECTOMY     TUBAL LIGATION     Social History   Tobacco Use   Smoking status: Never   Smokeless tobacco: Never  Substance Use Topics   Alcohol use: No   Drug use: No   Family History  Problem Relation Age of Onset   Arthritis Mother        "crippled"   Diabetes Mother    Hearing loss Mother    Heart disease Mother    Hearing loss Father    Vision loss Father    Cancer Brother        bladder   Diabetes Brother     Hyperlipidemia Brother    Early death Maternal Grandfather    Breast cancer Neg Hx    Lung cancer Neg Hx    Colon cancer Neg Hx    Allergies  Allergen Reactions   Zoster Vaccine Live Swelling   Review of Systems  Musculoskeletal:  Positive for joint pain (bilateral shoulders (ok currently)).  All other systems reviewed and are negative.     Objective:     BP 117/74   Pulse 83   Ht '5\' 9"'$  (1.753 m)   Wt 200 lb 9.6 oz (91 kg)   SpO2 95%   BMI 29.62 kg/m  BP Readings from Last 3 Encounters:  10/20/22 117/74  08/21/22 122/77  06/27/22 119/76   Physical Exam Vitals reviewed.  Constitutional:      General: She is not in acute distress.    Appearance: Normal appearance. She is not toxic-appearing.  HENT:     Head: Normocephalic and atraumatic.     Right Ear: External ear normal.     Left Ear: External ear normal.     Nose: Nose normal. No congestion or rhinorrhea.  Mouth/Throat:     Mouth: Mucous membranes are moist.     Pharynx: Oropharynx is clear. No oropharyngeal exudate or posterior oropharyngeal erythema.  Eyes:     General: No scleral icterus.    Extraocular Movements: Extraocular movements intact.     Conjunctiva/sclera: Conjunctivae normal.     Pupils: Pupils are equal, round, and reactive to light.  Cardiovascular:     Rate and Rhythm: Normal rate and regular rhythm.     Pulses: Normal pulses.     Heart sounds: Normal heart sounds. No murmur heard.    No friction rub. No gallop.  Pulmonary:     Effort: Pulmonary effort is normal.     Breath sounds: Normal breath sounds. No wheezing, rhonchi or rales.  Abdominal:     General: Abdomen is flat. Bowel sounds are normal. There is no distension.     Palpations: Abdomen is soft.     Tenderness: There is no abdominal tenderness.  Musculoskeletal:        General: No swelling. Normal range of motion.     Cervical back: Normal range of motion.     Right lower leg: No edema.     Left lower leg: No edema.      Comments: Able to actively abduct both shoulders to 180 degrees  Lymphadenopathy:     Cervical: No cervical adenopathy.  Skin:    General: Skin is warm and dry.     Capillary Refill: Capillary refill takes less than 2 seconds.     Coloration: Skin is not jaundiced.  Neurological:     General: No focal deficit present.     Mental Status: She is alert and oriented to person, place, and time.  Psychiatric:        Mood and Affect: Mood normal.        Behavior: Behavior normal.   Last CBC Lab Results  Component Value Date   WBC 5.9 06/27/2022   HGB 13.9 06/27/2022   HCT 41.0 06/27/2022   MCV 90 06/27/2022   MCH 30.4 06/27/2022   RDW 12.0 06/27/2022   PLT 251 25/63/8937   Last metabolic panel Lab Results  Component Value Date   GLUCOSE 99 06/27/2022   NA 143 06/27/2022   K 4.7 06/27/2022   CL 104 06/27/2022   CO2 19 (L) 06/27/2022   BUN 18 06/27/2022   CREATININE 0.79 06/27/2022   EGFR 79 06/27/2022   CALCIUM 9.9 06/27/2022   PROT 6.6 06/27/2022   ALBUMIN 4.5 06/27/2022   LABGLOB 2.1 06/27/2022   AGRATIO 2.1 06/27/2022   BILITOT 0.3 06/27/2022   ALKPHOS 65 06/27/2022   AST 21 06/27/2022   ALT 17 06/27/2022   Last lipids Lab Results  Component Value Date   CHOL 212 (H) 06/27/2022   HDL 73 06/27/2022   LDLCALC 123 (H) 06/27/2022   TRIG 90 06/27/2022   CHOLHDL 2.9 06/27/2022   Last hemoglobin A1c Lab Results  Component Value Date   HGBA1C 6.1 (H) 06/27/2022   Last thyroid functions Lab Results  Component Value Date   TSH 2.600 06/27/2022   Last vitamin D Lab Results  Component Value Date   VD25OH 46.9 06/27/2022   Last vitamin B12 and Folate Lab Results  Component Value Date   VITAMINB12 1,341 (H) 06/27/2022   FOLATE 17.8 06/27/2022    The 10-year ASCVD risk score (Arnett DK, et al., 2019) is: 26.1%    Assessment & Plan:   Problem List Items Addressed This Visit  HLD (hyperlipidemia) - Primary    Lovastatin was discontinued and  atorvastatin 40 mg daily was started at due to an elevated 10-year ASCVD risk 4 (28%).  She has not experienced any side effects with starting atorvastatin. -Repeat lipid panel ordered today      Screening for colon cancer    Negative Cologuard in May 2023.  We will update our records to reflect this.       Return in about 3 months (around 01/19/2023).    Johnette Abraham, MD

## 2022-10-21 LAB — LIPID PANEL
Chol/HDL Ratio: 3 ratio (ref 0.0–4.4)
Cholesterol, Total: 187 mg/dL (ref 100–199)
HDL: 62 mg/dL (ref >39)
LDL Chol Calc (NIH): 101 mg/dL — ABNORMAL HIGH (ref 0–99)
Triglycerides: 140 mg/dL (ref 0–149)
VLDL Cholesterol Cal: 24 mg/dL (ref 5–40)

## 2022-10-26 ENCOUNTER — Other Ambulatory Visit: Payer: Self-pay | Admitting: Internal Medicine

## 2022-10-26 DIAGNOSIS — E785 Hyperlipidemia, unspecified: Secondary | ICD-10-CM

## 2022-10-26 MED ORDER — ATORVASTATIN CALCIUM 40 MG PO TABS: 80.0000 mg | ORAL_TABLET | Freq: Every day | ORAL | 3 refills | Status: DC

## 2022-10-30 DIAGNOSIS — N751 Abscess of Bartholin's gland: Secondary | ICD-10-CM | POA: Diagnosis not present

## 2022-11-14 ENCOUNTER — Encounter: Payer: Self-pay | Admitting: Obstetrics and Gynecology

## 2022-11-14 ENCOUNTER — Ambulatory Visit: Payer: Medicare HMO | Admitting: Obstetrics and Gynecology

## 2022-11-14 VITALS — BP 119/66 | HR 90 | Ht 69.0 in | Wt 200.0 lb

## 2022-11-14 DIAGNOSIS — N907 Vulvar cyst: Secondary | ICD-10-CM | POA: Diagnosis not present

## 2022-11-14 NOTE — Progress Notes (Signed)
Audrey Ross presents in referral from [facility] for cyst.  Note middle of Jan. Placed on antibiotics which she has completed. Sponatous rupture with drainage of purulent material. Reports feels better and cyst is smaller as well.  Has had before in the same area  PE AF VSS Chaperone present  Left vulvar sebaceous cyst, 1 x 1 cm. Small opening in center. Able express purulent material without problems.  A/P Vulvar sebaceous cyst  Pt instructed on warm compresses and Sitz bath with self expression as well. F/U in 2 weeks for reevaluation

## 2022-11-26 ENCOUNTER — Encounter: Payer: Self-pay | Admitting: Internal Medicine

## 2022-11-26 DIAGNOSIS — M25511 Pain in right shoulder: Secondary | ICD-10-CM

## 2022-11-26 MED ORDER — MELOXICAM 15 MG PO TABS: 15.0000 mg | ORAL_TABLET | Freq: Every day | ORAL | 0 refills | Status: DC | PRN

## 2022-12-01 ENCOUNTER — Other Ambulatory Visit: Payer: Self-pay | Admitting: Nurse Practitioner

## 2022-12-07 ENCOUNTER — Other Ambulatory Visit: Payer: Self-pay | Admitting: Nurse Practitioner

## 2022-12-07 ENCOUNTER — Other Ambulatory Visit: Payer: Self-pay | Admitting: Internal Medicine

## 2022-12-08 ENCOUNTER — Encounter: Payer: Self-pay | Admitting: Obstetrics and Gynecology

## 2022-12-12 ENCOUNTER — Ambulatory Visit: Payer: Medicare HMO | Admitting: Obstetrics and Gynecology

## 2022-12-18 ENCOUNTER — Other Ambulatory Visit: Payer: Self-pay

## 2022-12-18 ENCOUNTER — Encounter: Payer: Self-pay | Admitting: Obstetrics and Gynecology

## 2022-12-18 ENCOUNTER — Other Ambulatory Visit: Payer: Self-pay | Admitting: Internal Medicine

## 2022-12-18 ENCOUNTER — Encounter: Payer: Self-pay | Admitting: Internal Medicine

## 2022-12-18 DIAGNOSIS — R1013 Epigastric pain: Secondary | ICD-10-CM

## 2022-12-18 DIAGNOSIS — M25511 Pain in right shoulder: Secondary | ICD-10-CM

## 2022-12-18 MED ORDER — PANTOPRAZOLE SODIUM 40 MG PO TBEC: 40.0000 mg | DELAYED_RELEASE_TABLET | Freq: Two times a day (BID) | ORAL | 0 refills | Status: DC

## 2022-12-18 MED ORDER — MELOXICAM 15 MG PO TABS: 15.0000 mg | ORAL_TABLET | Freq: Every day | ORAL | 0 refills | Status: DC | PRN

## 2023-01-03 ENCOUNTER — Encounter: Payer: Self-pay | Admitting: Internal Medicine

## 2023-01-05 ENCOUNTER — Other Ambulatory Visit: Payer: Self-pay

## 2023-01-05 DIAGNOSIS — R1013 Epigastric pain: Secondary | ICD-10-CM

## 2023-01-05 DIAGNOSIS — M25511 Pain in right shoulder: Secondary | ICD-10-CM

## 2023-01-05 MED ORDER — CITALOPRAM HYDROBROMIDE 20 MG PO TABS: 20.0000 mg | ORAL_TABLET | Freq: Every day | ORAL | 0 refills | Status: DC

## 2023-01-05 MED ORDER — MELOXICAM 15 MG PO TABS
15.0000 mg | ORAL_TABLET | Freq: Every day | ORAL | 0 refills | Status: DC | PRN
Start: 2023-01-05 — End: 2023-01-27

## 2023-01-05 MED ORDER — PANTOPRAZOLE SODIUM 40 MG PO TBEC
40.0000 mg | DELAYED_RELEASE_TABLET | Freq: Two times a day (BID) | ORAL | 0 refills | Status: DC
Start: 2023-01-05 — End: 2023-02-24

## 2023-01-05 MED ORDER — DAPAGLIFLOZIN PROPANEDIOL 10 MG PO TABS: 10.0000 mg | ORAL_TABLET | Freq: Every day | ORAL | 0 refills | Status: DC

## 2023-01-13 ENCOUNTER — Ambulatory Visit: Payer: Medicare HMO | Admitting: Obstetrics & Gynecology

## 2023-01-26 ENCOUNTER — Ambulatory Visit: Payer: Medicare HMO | Admitting: Internal Medicine

## 2023-01-27 ENCOUNTER — Encounter: Payer: Self-pay | Admitting: Internal Medicine

## 2023-01-27 ENCOUNTER — Other Ambulatory Visit: Payer: Self-pay

## 2023-01-27 DIAGNOSIS — M25511 Pain in right shoulder: Secondary | ICD-10-CM

## 2023-01-27 MED ORDER — MELOXICAM 15 MG PO TABS
15.0000 mg | ORAL_TABLET | Freq: Every day | ORAL | 0 refills | Status: DC | PRN
Start: 2023-01-27 — End: 2023-01-27

## 2023-01-27 MED ORDER — MELOXICAM 15 MG PO TABS
15.0000 mg | ORAL_TABLET | Freq: Every day | ORAL | 0 refills | Status: DC | PRN
Start: 2023-01-27 — End: 2023-06-29

## 2023-01-27 NOTE — Telephone Encounter (Signed)
Refill sent.

## 2023-02-02 ENCOUNTER — Telehealth: Payer: Self-pay | Admitting: Internal Medicine

## 2023-02-02 NOTE — Telephone Encounter (Signed)
Called patient to schedule Medicare Annual Wellness Visit (AWV). Left message for patient to call back and schedule Medicare Annual Wellness Visit (AWV).  Last date of AWV: 11/04/2021   Please schedule an appointment at any time with Abby, NHA. .  If any questions, please contact me at [phone].  Thank you,  Stephanie,  AMB Clinical Support [facility] AWV Program Direct Dial ??[phone]   

## 2023-02-04 ENCOUNTER — Encounter: Payer: Self-pay | Admitting: Obstetrics and Gynecology

## 2023-02-05 ENCOUNTER — Other Ambulatory Visit: Payer: Self-pay | Admitting: Nurse Practitioner

## 2023-02-05 DIAGNOSIS — E119 Type 2 diabetes mellitus without complications: Secondary | ICD-10-CM

## 2023-02-09 ENCOUNTER — Encounter: Payer: Self-pay | Admitting: Obstetrics and Gynecology

## 2023-02-10 ENCOUNTER — Ambulatory Visit: Payer: Medicare HMO | Admitting: Adult Health

## 2023-02-11 ENCOUNTER — Ambulatory Visit: Payer: Medicare HMO | Admitting: Adult Health

## 2023-02-11 ENCOUNTER — Encounter: Payer: Self-pay | Admitting: Obstetrics and Gynecology

## 2023-02-20 ENCOUNTER — Other Ambulatory Visit: Payer: Self-pay | Admitting: Internal Medicine

## 2023-02-24 ENCOUNTER — Encounter: Payer: Self-pay | Admitting: Internal Medicine

## 2023-02-24 ENCOUNTER — Ambulatory Visit (INDEPENDENT_AMBULATORY_CARE_PROVIDER_SITE_OTHER): Payer: Medicare HMO | Admitting: Internal Medicine

## 2023-02-24 VITALS — BP 105/69 | HR 77 | Ht 69.0 in | Wt 193.6 lb

## 2023-02-24 DIAGNOSIS — E118 Type 2 diabetes mellitus with unspecified complications: Secondary | ICD-10-CM | POA: Diagnosis not present

## 2023-02-24 DIAGNOSIS — Z7984 Long term (current) use of oral hypoglycemic drugs: Secondary | ICD-10-CM | POA: Diagnosis not present

## 2023-02-24 DIAGNOSIS — M2041 Other hammer toe(s) (acquired), right foot: Secondary | ICD-10-CM

## 2023-02-24 DIAGNOSIS — E785 Hyperlipidemia, unspecified: Secondary | ICD-10-CM

## 2023-02-24 NOTE — Patient Instructions (Signed)
It was a pleasure to see you today.  Thank you for giving [place] the opportunity to be involved in your care.  Below is a brief recap of your visit and next steps.  We will plan to see you again in 4 months.  Summary Podiatry referral placed for hammertoe Follow up in 4 months for annual exam

## 2023-02-24 NOTE — Progress Notes (Unsigned)
Established Patient Office Visit  Subjective   Patient ID: Audrey Ross, female    DOB: [birth year]  Age: 72 y.o. MRN: [id number]  Chief Complaint  Patient presents with   Hyperlipidemia    Follow up   Audrey Ross returns to care today for follow-up.  She was last evaluated by me on 1/15.  No medication changes were made at that time and 31-month follow-up was arranged.  In the interim she has been seen by OB/GYN.  There have otherwise been no acute interval events.  Audrey Ross reports feeling well today.  Her acute concern today is requesting a podiatry referral for evaluation of a hammertoe on her right foot.  She is otherwise asymptomatic and has no additional concerns to discuss.  Past Medical History:  Diagnosis Date   Allergy    hay fever   Anxiety    Arthritis    R knee. R foot, L shoudler   Cancer [facility])    hysterectomy 2009 cervical   Diabetes mellitus without complication (HCC)    GERD (gastroesophageal reflux disease)    Hyperlipidemia    Hypertension    Preventative health care 01/29/2022   Sleep apnea    no longer uses   Past Surgical History:  Procedure Laterality Date   ABDOMINAL HYSTERECTOMY     2009   CARPAL TUNNEL RELEASE     both hands   HEMORRHOID SURGERY     in high school   STAPEDES SURGERY     had 3 surgeries   TONSILLECTOMY     TUBAL LIGATION     Social History   Tobacco Use   Smoking status: Never   Smokeless tobacco: Never  Substance Use Topics   Alcohol use: No   Drug use: No   Family History  Problem Relation Age of Onset   Arthritis Mother        "crippled"   Diabetes Mother    Hearing loss Mother    Heart disease Mother    Hearing loss Father    Vision loss Father    Cancer Brother        bladder   Diabetes Brother    Hyperlipidemia Brother    Early death Maternal Grandfather    Breast cancer Neg Hx    Lung cancer Neg Hx    Colon cancer Neg Hx    Allergies  Allergen Reactions   Zoster Vaccine Live Swelling      Review of  Systems  Constitutional:  Negative for chills and fever.  HENT:  Negative for sore throat.   Respiratory:  Negative for cough and shortness of breath.   Cardiovascular:  Negative for chest pain, palpitations and leg swelling.  Gastrointestinal:  Negative for abdominal pain, blood in stool, constipation, diarrhea, nausea and vomiting.  Genitourinary:  Negative for dysuria and hematuria.  Musculoskeletal:  Negative for myalgias.  Skin:  Negative for itching and rash.  Neurological:  Negative for dizziness and headaches.  Psychiatric/Behavioral:  Negative for depression and suicidal ideas.       Objective:     BP 105/69   Pulse 77   Ht 5\' 9"  (1.753 m)   Wt 193 lb 9.6 oz (87.8 kg)   SpO2 98%   BMI 28.59 kg/m  BP Readings from Last 3 Encounters:  02/24/23 105/69  11/14/22 119/66  10/20/22 117/74      Physical Exam Vitals reviewed.  Constitutional:      General: She is not in acute distress.  Appearance: Normal appearance. She is not toxic-appearing.  HENT:     Head: Normocephalic and atraumatic.     Right Ear: External ear normal.     Left Ear: External ear normal.     Nose: Nose normal. No congestion or rhinorrhea.     Mouth/Throat:     Mouth: Mucous membranes are moist.     Pharynx: Oropharynx is clear. No oropharyngeal exudate or posterior oropharyngeal erythema.  Eyes:     General: No scleral icterus.    Extraocular Movements: Extraocular movements intact.     Conjunctiva/sclera: Conjunctivae normal.     Pupils: Pupils are equal, round, and reactive to light.  Cardiovascular:     Rate and Rhythm: Normal rate and regular rhythm.     Pulses: Normal pulses.     Heart sounds: Normal heart sounds. No murmur heard.    No friction rub. No gallop.  Pulmonary:     Effort: Pulmonary effort is normal.     Breath sounds: Normal breath sounds. No wheezing, rhonchi or rales.  Abdominal:     General: Abdomen is flat. Bowel sounds are normal. There is no distension.      Palpations: Abdomen is soft.     Tenderness: There is no abdominal tenderness.  Musculoskeletal:        General: Deformity (Hammertoe fourth digit of right foot) present. No swelling. Normal range of motion.     Cervical back: Normal range of motion.     Right lower leg: No edema.     Left lower leg: No edema.  Lymphadenopathy:     Cervical: No cervical adenopathy.  Skin:    General: Skin is warm and dry.     Capillary Refill: Capillary refill takes less than 2 seconds.     Coloration: Skin is not jaundiced.  Neurological:     General: No focal deficit present.     Mental Status: She is alert and oriented to person, place, and time.  Psychiatric:        Mood and Affect: Mood normal.        Behavior: Behavior normal.      No results found for any visits on 02/24/23.  Last CBC Lab Results  Component Value Date   WBC 5.9 06/27/2022   HGB 13.9 06/27/2022   HCT 41.0 06/27/2022   MCV 90 06/27/2022   MCH 30.4 06/27/2022   RDW 12.0 06/27/2022   PLT 251 06/27/2022   Last metabolic panel Lab Results  Component Value Date   GLUCOSE 99 06/27/2022   NA 143 06/27/2022   K 4.7 06/27/2022   CL 104 06/27/2022   CO2 19 (L) 06/27/2022   BUN 18 06/27/2022   CREATININE 0.79 06/27/2022   EGFR 79 06/27/2022   CALCIUM 9.9 06/27/2022   PROT 6.6 06/27/2022   ALBUMIN 4.5 06/27/2022   LABGLOB 2.1 06/27/2022   AGRATIO 2.1 06/27/2022   BILITOT 0.3 06/27/2022   ALKPHOS 65 06/27/2022   AST 21 06/27/2022   ALT 17 06/27/2022   Last lipids Lab Results  Component Value Date   CHOL 187 10/20/2022   HDL 62 10/20/2022   LDLCALC 101 (H) 10/20/2022   TRIG 140 10/20/2022   CHOLHDL 3.0 10/20/2022   Last hemoglobin A1c Lab Results  Component Value Date   HGBA1C 6.1 (H) 06/27/2022   Last thyroid functions Lab Results  Component Value Date   TSH 2.600 06/27/2022   Last vitamin D Lab Results  Component Value Date   VD25OH 72.9  06/27/2022   Last vitamin B12 and Folate Lab Results   Component Value Date   VITAMINB12 1,341 (H) 06/27/2022   FOLATE 17.8 06/27/2022   The 10-year ASCVD risk score (Arnett DK, et al., 2019) is: 21.3%    Assessment & Plan:   Problem List Items Addressed This Visit       Controlled diabetes mellitus type 2 with complications (HCC)    A1c 6.1 in September 2023.  She is prescribed Farxiga 10 mg daily and metformin XR 500 mg daily. -Podiatry referral placed today for diabetic foot exam -Recommended that she contact her ophthalmologist for diabetic eye exam -Repeat A1c at follow-up      Hammertoe of right foot - Primary    Podiatry referral placed today      HLD (hyperlipidemia)    She is currently prescribed atorvastatin 40 mg daily.  Lipid panel last updated in January.  Total cholesterol 187 and LDL 101. -Repeat lipid panel today.  Consider increasing atorvastatin if LDL remains elevated.       Return in about 4 months (around 06/27/2023) for CPE.    Billie Lade, MD

## 2023-02-26 DIAGNOSIS — M2041 Other hammer toe(s) (acquired), right foot: Secondary | ICD-10-CM | POA: Insufficient documentation

## 2023-02-26 NOTE — Assessment & Plan Note (Signed)
A1c 6.1 in September 2023.  She is prescribed Farxiga 10 mg daily and metformin XR 500 mg daily. -Podiatry referral placed today for diabetic foot exam -Recommended that she contact her ophthalmologist for diabetic eye exam -Repeat A1c at follow-up

## 2023-02-26 NOTE — Assessment & Plan Note (Signed)
Podiatry referral placed today

## 2023-02-26 NOTE — Assessment & Plan Note (Signed)
She is currently prescribed atorvastatin 40 mg daily.  Lipid panel last updated in January.  Total cholesterol 187 and LDL 101. -Repeat lipid panel today.  Consider increasing atorvastatin if LDL remains elevated.

## 2023-03-02 ENCOUNTER — Other Ambulatory Visit: Payer: Self-pay | Admitting: Internal Medicine

## 2023-03-14 ENCOUNTER — Other Ambulatory Visit: Payer: Self-pay | Admitting: Internal Medicine

## 2023-03-15 ENCOUNTER — Encounter: Payer: Self-pay | Admitting: Obstetrics and Gynecology

## 2023-03-17 ENCOUNTER — Encounter: Payer: Self-pay | Admitting: Obstetrics & Gynecology

## 2023-03-17 ENCOUNTER — Ambulatory Visit: Payer: Medicare HMO | Admitting: Obstetrics & Gynecology

## 2023-03-17 VITALS — BP 121/75 | HR 91

## 2023-03-17 DIAGNOSIS — N764 Abscess of vulva: Secondary | ICD-10-CM | POA: Diagnosis not present

## 2023-03-17 MED ORDER — DOXYCYCLINE HYCLATE 100 MG PO TABS: 100.0000 mg | ORAL_TABLET | Freq: Two times a day (BID) | ORAL | 0 refills | Status: DC

## 2023-03-17 MED ORDER — SILVER SULFADIAZINE 1 % EX CREA: TOPICAL_CREAM | CUTANEOUS | 11 refills | Status: AC

## 2023-03-17 NOTE — Progress Notes (Signed)
Chief Complaint  Patient presents with   Abscess      72 y.o. No obstetric history on file. No LMP recorded. Patient has had a hysterectomy. The current method of family planning is status post hysterectomy.  Outpatient Encounter Medications as of 03/17/2023  Medication Sig   aspirin EC 81 MG tablet Take 81 mg by mouth daily. Swallow whole.   atorvastatin (LIPITOR) 40 MG tablet Take 2 tablets (80 mg total) by mouth daily.   cholecalciferol (VITAMIN D) 1000 units tablet Take 1,000 Units by mouth daily.   citalopram (CELEXA) 20 MG tablet Take 1 tablet (20 mg total) by mouth daily.   doxycycline (VIBRA-TABS) 100 MG tablet Take 1 tablet (100 mg total) by mouth 2 (two) times daily.   FARXIGA 10 MG TABS tablet Take 1 tablet by mouth once daily   lisinopril (ZESTRIL) 5 MG tablet Take 1 tablet by mouth once daily   meloxicam (MOBIC) 15 MG tablet Take 1 tablet (15 mg total) by mouth daily as needed for pain.   pantoprazole (PROTONIX) 40 MG tablet Take 1 tablet (40 mg total) by mouth 2 (two) times daily.   silver sulfADIAZINE (SILVADENE) 1 % cream Use as directed   [DISCONTINUED] Cyanocobalamin (B-12 PO) Take by mouth. Once a day   [DISCONTINUED] FOLIC ACID PO Take by mouth. Once a day   [DISCONTINUED] metFORMIN (GLUCOPHAGE-XR) 500 MG 24 hr tablet Take 1 tablet by mouth once daily with breakfast   [DISCONTINUED] Multiple Vitamins-Minerals (MULTIPLE VITAMINS/WOMENS PO) Take by mouth. Once a day   No facility-administered encounter medications on file as of 03/17/2023.    Subjective Pt with a bump on her bottom for about 1 week Did "bathroom" surgery and released fluid Took her husband's doxycycline  Past Medical History:  Diagnosis Date   Allergy    hay fever   Anxiety    Arthritis    R knee. R foot, L shoudler   Cancer [facility])    hysterectomy 2009 cervical   Diabetes mellitus without complication (HCC)    GERD (gastroesophageal reflux disease)    Hyperlipidemia     Hypertension    Preventative health care 01/29/2022   Sleep apnea    no longer uses    Past Surgical History:  Procedure Laterality Date   ABDOMINAL HYSTERECTOMY     2009   CARPAL TUNNEL RELEASE     both hands   HEMORRHOID SURGERY     in high school   STAPEDES SURGERY     had 3 surgeries   TONSILLECTOMY     TUBAL LIGATION      OB History   No obstetric history on file.     Allergies  Allergen Reactions   Zoster Vaccine Live Swelling    Social History   Socioeconomic History   Marital status: Married    Spouse name: Rocky Link   Number of children: 2   Years of education: 12   Highest education level: Not on file  Occupational History   Occupation: retired    Comment: clerical, self  Tobacco Use   Smoking status: Never   Smokeless tobacco: Never  Substance and Sexual Activity   Alcohol use: No   Drug use: No   Sexual activity: Not Currently  Other Topics Concern   Not on file  Social History Narrative   Moved from [place]   Lives with husband with Alzheimers   Has 4 little dogs   Rented trailer   Social Determinants  of Health   Financial Resource Strain: Not on file  Food Insecurity: Not on file  Transportation Needs: Not on file  Physical Activity: Not on file  Stress: Not on file  Social Connections: Not on file    Family History  Problem Relation Age of Onset   Arthritis Mother        "crippled"   Diabetes Mother    Hearing loss Mother    Heart disease Mother    Hearing loss Father    Vision loss Father    Cancer Brother        bladder   Diabetes Brother    Hyperlipidemia Brother    Early death Maternal Grandfather    Breast cancer Neg Hx    Lung cancer Neg Hx    Colon cancer Neg Hx     Medications:       Current Outpatient Medications:    aspirin EC 81 MG tablet, Take 81 mg by mouth daily. Swallow whole., Disp: , Rfl:    atorvastatin (LIPITOR) 40 MG tablet, Take 2 tablets (80 mg total) by mouth daily., Disp: 90 tablet, Rfl: 3    cholecalciferol (VITAMIN D) 1000 units tablet, Take 1,000 Units by mouth daily., Disp: , Rfl:    citalopram (CELEXA) 20 MG tablet, Take 1 tablet (20 mg total) by mouth daily., Disp: 90 tablet, Rfl: 0   doxycycline (VIBRA-TABS) 100 MG tablet, Take 1 tablet (100 mg total) by mouth 2 (two) times daily., Disp: 28 tablet, Rfl: 0   FARXIGA 10 MG TABS tablet, Take 1 tablet by mouth once daily, Disp: 30 tablet, Rfl: 0   lisinopril (ZESTRIL) 5 MG tablet, Take 1 tablet by mouth once daily, Disp: 90 tablet, Rfl: 0   meloxicam (MOBIC) 15 MG tablet, Take 1 tablet (15 mg total) by mouth daily as needed for pain., Disp: 90 tablet, Rfl: 0   pantoprazole (PROTONIX) 40 MG tablet, Take 1 tablet (40 mg total) by mouth 2 (two) times daily., Disp: 180 tablet, Rfl: 0   silver sulfADIAZINE (SILVADENE) 1 % cream, Use as directed, Disp: 50 g, Rfl: 11  Objective Blood pressure 121/75, pulse 91.  Small right vulvar boil with surrounding induration, peu d'orange changes Skin peeling from decreased swelling  Pertinent ROS No burning with urination, frequency or urgency No nausea, vomiting or diarrhea Nor fever chills or other constitutional symptoms    Labs or studies     Impression + Management Plan: Diagnoses this Encounter::   ICD-10-CM   1. Vulvar boil  N76.4    Rx: doxycycline 100 mg BID x 14 days, wet/wram compresses, silvadene cream, follow up in 10 days        Medications prescribed during  this encounter: Meds ordered this encounter  Medications   silver sulfADIAZINE (SILVADENE) 1 % cream    Sig: Use as directed    Dispense:  50 g    Refill:  11   doxycycline (VIBRA-TABS) 100 MG tablet    Sig: Take 1 tablet (100 mg total) by mouth 2 (two) times daily.    Dispense:  28 tablet    Refill:  0    Labs or Scans Ordered during this encounter: No orders of the defined types were placed in this encounter.     Follow up No follow-ups on file.

## 2023-03-27 ENCOUNTER — Ambulatory Visit: Payer: Medicare HMO | Admitting: Obstetrics & Gynecology

## 2023-03-27 ENCOUNTER — Encounter: Payer: Self-pay | Admitting: Obstetrics & Gynecology

## 2023-03-27 VITALS — BP 127/79 | HR 76

## 2023-03-27 DIAGNOSIS — N764 Abscess of vulva: Secondary | ICD-10-CM

## 2023-03-27 DIAGNOSIS — R6882 Decreased libido: Secondary | ICD-10-CM

## 2023-03-27 NOTE — Progress Notes (Unsigned)
Follow up appointment for results: Right vulvar boil  Chief Complaint  Patient presents with   Follow-up    On boil    Blood pressure 127/79, pulse 76.  No evidence of infection, induration post infection but non tender  MEDS ordered this encounter: No orders of the defined types were placed in this encounter.   Orders for this encounter: No orders of the defined types were placed in this encounter.   Impression + Management Plan   ICD-10-CM   1. Vulvar boil  N76.4     2. Low libido  R68.82    recommende [employer] low libido support      Follow Up: Return if symptoms worsen or fail to improve.     All questions were answered.  Past Medical History:  Diagnosis Date   Allergy    hay fever   Anxiety    Arthritis    R knee. R foot, L shoudler   Cancer [facility])    hysterectomy 2009 cervical   Diabetes mellitus without complication (HCC)    GERD (gastroesophageal reflux disease)    Hyperlipidemia    Hypertension    Preventative health care 01/29/2022   Sleep apnea    no longer uses    Past Surgical History:  Procedure Laterality Date   ABDOMINAL HYSTERECTOMY     2009   CARPAL TUNNEL RELEASE     both hands   HEMORRHOID SURGERY     in high school   STAPEDES SURGERY     had 3 surgeries   TONSILLECTOMY     TUBAL LIGATION      OB History   No obstetric history on file.     Allergies  Allergen Reactions   Zoster Vaccine Live Swelling    Social History   Socioeconomic History   Marital status: Married    Spouse name: Rocky Link   Number of children: 2   Years of education: 12   Highest education level: Not on file  Occupational History   Occupation: retired    Comment: clerical, self  Tobacco Use   Smoking status: Never   Smokeless tobacco: Never  Substance and Sexual Activity   Alcohol use: No   Drug use: No   Sexual activity: Not Currently  Other Topics Concern   Not on file  Social History Narrative   Moved from [place]   Lives with husband with  Alzheimers   Has 4 little dogs   Rented trailer   Social Determinants of Corporate investment banker Strain: Not on file  Food Insecurity: Not on file  Transportation Needs: Not on file  Physical Activity: Not on file  Stress: Not on file  Social Connections: Not on file    Family History  Problem Relation Age of Onset   Arthritis Mother        "crippled"   Diabetes Mother    Hearing loss Mother    Heart disease Mother    Hearing loss Father    Vision loss Father    Cancer Brother        bladder   Diabetes Brother    Hyperlipidemia Brother    Early death Maternal Grandfather    Breast cancer Neg Hx    Lung cancer Neg Hx    Colon cancer Neg Hx

## 2023-04-20 DIAGNOSIS — E785 Hyperlipidemia, unspecified: Secondary | ICD-10-CM | POA: Diagnosis not present

## 2023-04-21 LAB — LIPID PANEL
Chol/HDL Ratio: 3.8 ratio (ref 0.0–4.4)
Cholesterol, Total: 268 mg/dL — ABNORMAL HIGH (ref 100–199)
HDL: 71 mg/dL (ref >39)
LDL Chol Calc (NIH): 168 mg/dL — ABNORMAL HIGH (ref 0–99)
Triglycerides: 161 mg/dL — ABNORMAL HIGH (ref 0–149)
VLDL Cholesterol Cal: 29 mg/dL (ref 5–40)

## 2023-04-22 ENCOUNTER — Other Ambulatory Visit: Payer: Self-pay | Admitting: Internal Medicine

## 2023-04-23 ENCOUNTER — Other Ambulatory Visit: Payer: Self-pay

## 2023-04-23 MED ORDER — FARXIGA 10 MG PO TABS: 10.0000 mg | ORAL_TABLET | Freq: Every day | ORAL | 0 refills | Status: DC

## 2023-05-08 ENCOUNTER — Ambulatory Visit (INDEPENDENT_AMBULATORY_CARE_PROVIDER_SITE_OTHER): Payer: Medicare HMO

## 2023-05-08 DIAGNOSIS — Z0000 Encounter for general adult medical examination without abnormal findings: Secondary | ICD-10-CM | POA: Diagnosis not present

## 2023-05-08 NOTE — Progress Notes (Signed)
Subjective:   Audrey Ross is a 72 y.o. female who presents for an Initial Medicare Annual Wellness Visit.  Visit Complete: Virtual  I connected with  Audrey Ross on 05/08/23 by a audio enabled telemedicine application and verified that I am speaking with the correct person using two identifiers.  Patient Location: Home  Provider Location: Office/Clinic  I discussed the limitations of evaluation and management by telemedicine. The patient expressed understanding and agreed to proceed.  Patient Medicare AWV questionnaire was completed by the patient on 05/08/2023; I have confirmed that all information answered by patient is correct and no changes since this date.  Vital Signs: Unable to obtain new vitals due to this being a telehealth visit.   Review of Systems    Nutrition Risk Assessment:  Has the patient had any N/V/D within the last 2 months?  No  Does the patient have any non-healing wounds?  No  Has the patient had any unintentional weight loss or weight gain?  No   Diabetes:  Is the patient diabetic?  No  If diabetic, was a CBG obtained today?  No  Did the patient bring in their glucometer from home?  No  How often do you monitor your CBG's? She does not check sugar.   Financial Strains and Diabetes Management:  Are you having any financial strains with the device, your supplies or your medication? No .  Does the patient want to be seen by Chronic Care Management for management of their diabetes?  No  Would the patient like to be referred to a Nutritionist or for Diabetic Management?  No   Diabetic Exams:  Diabetic Eye Exam: Completed DUE. Overdue for diabetic eye exam. Pt has been advised about the importance in completing this exam. A referral has been placed today. Message sent to referral coordinator for scheduling purposes. Advised pt to expect a call from office referred to regarding appt.  Diabetic Foot Exam: Completed DUE. Pt has been advised about the  importance in completing this exam. Pt is scheduled for diabetic foot exam on 02/24/2023.         Objective:    There were no vitals filed for this visit. There is no height or weight on file to calculate BMI.     05/08/2023   10:43 AM 05/07/2017    9:06 AM  Advanced Directives  Does Patient Have a Medical Advance Directive? No No  Would patient like information on creating a medical advance directive? No - Patient declined No - Patient declined    Current Medications (verified) Outpatient Encounter Medications as of 05/08/2023  Medication Sig   aspirin EC 81 MG tablet Take 81 mg by mouth daily. Swallow whole.   atorvastatin (LIPITOR) 40 MG tablet Take 2 tablets (80 mg total) by mouth daily.   cholecalciferol (VITAMIN D) 1000 units tablet Take 1,000 Units by mouth daily.   citalopram (CELEXA) 20 MG tablet Take 1 tablet (20 mg total) by mouth daily.   doxycycline (VIBRA-TABS) 100 MG tablet Take 1 tablet (100 mg total) by mouth 2 (two) times daily.   FARXIGA 10 MG TABS tablet Take 1 tablet (10 mg total) by mouth daily.   lisinopril (ZESTRIL) 5 MG tablet Take 1 tablet by mouth once daily   meloxicam (MOBIC) 15 MG tablet Take 1 tablet (15 mg total) by mouth daily as needed for pain.   pantoprazole (PROTONIX) 40 MG tablet Take 1 tablet (40 mg total) by mouth 2 (two) times daily.  silver sulfADIAZINE (SILVADENE) 1 % cream Use as directed   No facility-administered encounter medications on file as of 05/08/2023.    Allergies (verified) Zoster vaccine live   History: Past Medical History:  Diagnosis Date   Allergy    hay fever   Anxiety    Arthritis    R knee. R foot, L shoudler   Cancer [facility])    hysterectomy 2009 cervical   Diabetes mellitus without complication (HCC)    GERD (gastroesophageal reflux disease)    Hyperlipidemia    Hypertension    Preventative health care 01/29/2022   Sleep apnea    no longer uses   Past Surgical History:  Procedure Laterality Date    ABDOMINAL HYSTERECTOMY     2009   CARPAL TUNNEL RELEASE     both hands   HEMORRHOID SURGERY     in high school   STAPEDES SURGERY     had 3 surgeries   TONSILLECTOMY     TUBAL LIGATION     Family History  Problem Relation Age of Onset   Arthritis Mother        "crippled"   Diabetes Mother    Hearing loss Mother    Heart disease Mother    Hearing loss Father    Vision loss Father    Cancer Brother        bladder   Diabetes Brother    Hyperlipidemia Brother    Early death Maternal Grandfather    Breast cancer Neg Hx    Lung cancer Neg Hx    Colon cancer Neg Hx    Social History   Socioeconomic History   Marital status: Married    Spouse name: Rocky Link   Number of children: 2   Years of education: 12   Highest education level: Not on file  Occupational History   Occupation: retired    Comment: clerical, self  Tobacco Use   Smoking status: Never   Smokeless tobacco: Never  Substance and Sexual Activity   Alcohol use: No   Drug use: No   Sexual activity: Not Currently  Other Topics Concern   Not on file  Social History Narrative   Moved from [place]   Lives with husband with Alzheimers   Has 4 little dogs   Rented trailer   Social Determinants of Health   Financial Resource Strain: Low Risk  (05/08/2023)   Overall Financial Resource Strain (CARDIA)    Difficulty of Paying Living Expenses: Not hard at all  Food Insecurity: No Food Insecurity (05/08/2023)   Hunger Vital Sign    Worried About Running Out of Food in the Last Year: Never true    Ran Out of Food in the Last Year: Never true  Transportation Needs: No Transportation Needs (05/08/2023)   PRAPARE - Administrator, Civil Service (Medical): No    Lack of Transportation (Non-Medical): No  Physical Activity: Inactive (05/08/2023)   Exercise Vital Sign    Days of Exercise per Week: 0 days    Minutes of Exercise per Session: 0 min  Stress: No Stress Concern Present (05/08/2023)   [employer] of  Occupational Health - Occupational Stress Questionnaire    Feeling of Stress : Not at all  Social Connections: Socially Isolated (05/08/2023)   Social Connection and Isolation Panel [NHANES]    Frequency of Communication with Friends and Family: Once a week    Frequency of Social Gatherings with Friends and Family: Never    Attends Religious  Services: Never    Database administrator or Organizations: No    Attends Engineer, structural: Never    Marital Status: Married    Tobacco Counseling Counseling given: Not Answered   Clinical Intake:  Pre-visit preparation completed: No  Pain : No/denies pain     Diabetes: Yes CBG done?: No Did pt. bring in CBG monitor from home?: No  How often do you need to have someone help you when you read instructions, pamphlets, or other written materials from your doctor or pharmacy?: 1 - Never What is the last grade level you completed in school?: 12  Interpreter Needed?: No      Activities of Daily Living    05/08/2023   10:41 AM  In your present state of health, do you have any difficulty performing the following activities:  Hearing? 0  Vision? 0  Difficulty concentrating or making decisions? 0  Walking or climbing stairs? 0  Dressing or bathing? 0  Doing errands, shopping? 0  Preparing Food and eating ? N  Using the Toilet? N  In the past six months, have you accidently leaked urine? N  Do you have problems with loss of bowel control? N  Managing your Medications? N  Managing your Finances? N  Housekeeping or managing your Housekeeping? N    Patient Care Team: Billie Lade, MD as PCP - General (Internal Medicine)  Indicate any recent Medical Services you may have received from other than [facility] providers in the past year (date may be approximate).     Assessment:   This is a routine wellness examination for Mahkayla.  Hearing/Vision screen No results found.  Dietary issues and exercise activities  discussed:     Goals Addressed             This Visit's Progress    Patient Stated   On track    To be healthy       Depression Screen    05/08/2023   10:43 AM 10/20/2022   10:27 AM 08/21/2022    8:48 AM 06/27/2022   11:02 AM 01/29/2022    9:11 AM 10/29/2021    1:28 PM 11/09/2017    9:32 AM  PHQ 2/9 Scores  PHQ - 2 Score 0 0 0 0 0 0 0  PHQ- 9 Score  0         Fall Risk    05/08/2023   10:43 AM 10/20/2022   10:27 AM 08/21/2022    8:48 AM 06/27/2022   11:02 AM 01/29/2022    9:11 AM  Fall Risk   Falls in the past year? 0 0 0 0 0  Number falls in past yr: 0 0 0 0 0  Injury with Fall? 0 0 0 0 0  Risk for fall due to :  No Fall Risks No Fall Risks No Fall Risks No Fall Risks  Follow up  Falls evaluation completed Falls evaluation completed  Falls evaluation completed    MEDICARE RISK AT HOME:  Medicare Risk at Home - 05/08/23 1044     Any stairs in or around the home? No    If so, are there any without handrails? No    Home free of loose throw rugs in walkways, pet beds, electrical cords, etc? Yes    Adequate lighting in your home to reduce risk of falls? Yes    Life alert? No    Use of a cane, walker or w/c? No    Grab  bars in the bathroom? No    Shower chair or bench in shower? No    Elevated toilet seat or a handicapped toilet? No             TIMED UP AND GO:  Was the test performed? No    Cognitive Function:        05/08/2023   10:44 AM  6CIT Screen  What Year? 0 points  What month? 0 points  What time? 0 points  Count back from 20 0 points  Months in reverse 0 points  Repeat phrase 0 points  Total Score 0 points    Immunizations Immunization History  Administered Date(s) Administered   Influenza, High Dose Seasonal PF 08/11/2018   Influenza,inj,Quad PF,6+ Mos 06/24/2017   Influenza-Unspecified 08/23/2013, 06/24/2017, 08/14/2018   Pneumococcal Conjugate-13 10/23/2014   Pneumococcal Polysaccharide-23 06/18/2009   Td 05/04/2013    TDAP  status: Due, Education has been provided regarding the importance of this vaccine. Advised may receive this vaccine at local pharmacy or Health Dept. Aware to provide a copy of the vaccination record if obtained from local pharmacy or Health Dept. Verbalized acceptance and understanding.  Flu Vaccine status: Due, Education has been provided regarding the importance of this vaccine. Advised may receive this vaccine at local pharmacy or Health Dept. Aware to provide a copy of the vaccination record if obtained from local pharmacy or Health Dept. Verbalized acceptance and understanding.    Covid-19 vaccine status: Declined, Education has been provided regarding the importance of this vaccine but patient still declined. Advised may receive this vaccine at local pharmacy or Health Dept.or vaccine clinic. Aware to provide a copy of the vaccination record if obtained from local pharmacy or Health Dept. Verbalized acceptance and understanding.  Qualifies for Shingles Vaccine? Yes   Zostavax completed No   Shingrix Completed?: No.    Education has been provided regarding the importance of this vaccine. Patient has been advised to call insurance company to determine out of pocket expense if they have not yet received this vaccine. Advised may also receive vaccine at local pharmacy or Health Dept. Verbalized acceptance and understanding.  Screening Tests Health Maintenance  Topic Date Due   OPHTHALMOLOGY EXAM  05/15/2018   HEMOGLOBIN A1C  12/26/2022   FOOT EXAM  01/30/2023   DTaP/Tdap/Td (2 - Tdap) 05/05/2023   INFLUENZA VACCINE  05/07/2023   Pneumonia Vaccine 63+ Years old (3 of 3 - PPSV23 or PCV20) 06/28/2023 (Originally 10/24/2019)   Diabetic kidney evaluation - eGFR measurement  06/28/2023   Diabetic kidney evaluation - Urine ACR  06/28/2023   MAMMOGRAM  03/05/2024   Medicare Annual Wellness (AWV)  05/07/2024   Fecal DNA (Cologuard)  03/04/2025   DEXA SCAN  Completed   Hepatitis C Screening   Completed   HPV VACCINES  Aged Out   Colonoscopy  Discontinued   COVID-19 Vaccine  Discontinued   Zoster Vaccines- Shingrix  Discontinued    Health Maintenance  Health Maintenance Due  Topic Date Due   OPHTHALMOLOGY EXAM  05/15/2018   HEMOGLOBIN A1C  12/26/2022   FOOT EXAM  01/30/2023   DTaP/Tdap/Td (2 - Tdap) 05/05/2023   INFLUENZA VACCINE  05/07/2023    Colorectal cancer screening: No longer required.   Mammogram status: Completed  . Repeat every year  Bone Density status: Completed  . Results reflect: Bone density results: NORMAL. Repeat every 5 years.  Lung Cancer Screening: (Low Dose CT Chest recommended if Age 69-80 years, 20 pack-year currently smoking  OR have quit w/in 15years.) does not qualify.   Lung Cancer Screening Referral:   Additional Screening:  Hepatitis C Screening: does qualify; Completed   Vision Screening: Recommended annual ophthalmology exams for early detection of glaucoma and other disorders of the eye. Is the patient up to date with their annual eye exam?  No  Who is the provider or what is the name of the office in which the patient attends annual eye exams? N/A If pt is not established with a provider, would they like to be referred to a provider to establish care? No .   Dental Screening: Recommended annual dental exams for proper oral hygiene  Diabetic Foot Exam: Diabetic Foot Exam: Overdue, Pt has been advised about the importance in completing this exam. Pt is scheduled for diabetic foot exam on 02/24/2023.  Community Resource Referral / Chronic Care Management: CRR required this visit?  No   CCM required this visit?  No     Plan:     I have personally reviewed and noted the following in the patient's chart:   Medical and social history Use of alcohol, tobacco or illicit drugs  Current medications and supplements including opioid prescriptions. Patient is not currently taking opioid prescriptions. Functional ability and  status Nutritional status Physical activity Advanced directives List of other physicians Hospitalizations, surgeries, and ER visits in previous 12 months Vitals Screenings to include cognitive, depression, and falls Referrals and appointments  In addition, I have reviewed and discussed with patient certain preventive protocols, quality metrics, and best practice recommendations. A written personalized care plan for preventive services as well as general preventive health recommendations were provided to patient.     Jacklynn Barnacle, CMA   05/08/2023   After Visit Summary: (Declined) Due to this being a telephonic visit, with patients personalized plan was offered to patient but patient Declined AVS at this time   Nurse Notes:  Ms. Maggard , Thank you for taking time to come for your Medicare Wellness Visit. I appreciate your ongoing commitment to your health goals. Please review the following plan we discussed and let me know if I can assist you in the future.   These are the goals we discussed:  Goals      HEMOGLOBIN A1C < 7.0     LDL CALC < 100     Patient Stated     To be healthy      Weight (lb) < 175 lb (79.4 kg)        This is a list of the screening recommended for you and due dates:  Health Maintenance  Topic Date Due   Eye exam for diabetics  05/15/2018   Hemoglobin A1C  12/26/2022   Complete foot exam   01/30/2023   DTaP/Tdap/Td vaccine (2 - Tdap) 05/05/2023   Flu Shot  05/07/2023   Pneumonia Vaccine (3 of 3 - PPSV23 or PCV20) 06/28/2023*   Yearly kidney function blood test for diabetes  06/28/2023   Yearly kidney health urinalysis for diabetes  06/28/2023   Mammogram  03/05/2024   Medicare Annual Wellness Visit  05/07/2024   Cologuard (Stool DNA test)  03/04/2025   DEXA scan (bone density measurement)  Completed   Hepatitis C Screening  Completed   HPV Vaccine  Aged Out   Colon Cancer Screening  Discontinued   COVID-19 Vaccine  Discontinued   Zoster  (Shingles) Vaccine  Discontinued  *Topic was postponed. The date shown is not the original due date.

## 2023-05-08 NOTE — Patient Instructions (Signed)

## 2023-05-21 DIAGNOSIS — Z1283 Encounter for screening for malignant neoplasm of skin: Secondary | ICD-10-CM | POA: Diagnosis not present

## 2023-05-21 DIAGNOSIS — D225 Melanocytic nevi of trunk: Secondary | ICD-10-CM | POA: Diagnosis not present

## 2023-06-15 ENCOUNTER — Other Ambulatory Visit: Payer: Self-pay | Admitting: Internal Medicine

## 2023-06-15 DIAGNOSIS — R1013 Epigastric pain: Secondary | ICD-10-CM

## 2023-06-16 MED ORDER — PANTOPRAZOLE SODIUM 40 MG PO TBEC
40.0000 mg | DELAYED_RELEASE_TABLET | Freq: Two times a day (BID) | ORAL | 0 refills | Status: DC
Start: 2023-06-16 — End: 2023-09-25

## 2023-06-27 ENCOUNTER — Other Ambulatory Visit: Payer: Self-pay | Admitting: Internal Medicine

## 2023-06-27 DIAGNOSIS — M25511 Pain in right shoulder: Secondary | ICD-10-CM

## 2023-06-29 ENCOUNTER — Ambulatory Visit: Payer: Medicare HMO | Admitting: Internal Medicine

## 2023-07-01 ENCOUNTER — Other Ambulatory Visit: Payer: Self-pay | Admitting: Internal Medicine

## 2023-07-01 DIAGNOSIS — M25511 Pain in right shoulder: Secondary | ICD-10-CM

## 2023-08-06 ENCOUNTER — Ambulatory Visit: Payer: Medicare HMO | Admitting: Internal Medicine

## 2023-08-08 ENCOUNTER — Other Ambulatory Visit: Payer: Self-pay | Admitting: Internal Medicine

## 2023-08-10 ENCOUNTER — Encounter: Payer: Self-pay | Admitting: Internal Medicine

## 2023-09-05 ENCOUNTER — Other Ambulatory Visit: Payer: Self-pay | Admitting: Internal Medicine

## 2023-09-19 ENCOUNTER — Other Ambulatory Visit: Payer: Self-pay | Admitting: Internal Medicine

## 2023-09-25 ENCOUNTER — Encounter: Payer: Self-pay | Admitting: Internal Medicine

## 2023-09-25 ENCOUNTER — Ambulatory Visit (INDEPENDENT_AMBULATORY_CARE_PROVIDER_SITE_OTHER): Payer: Medicare HMO | Admitting: Internal Medicine

## 2023-09-25 VITALS — BP 131/76 | HR 74 | Ht 69.0 in | Wt 190.0 lb

## 2023-09-25 DIAGNOSIS — E785 Hyperlipidemia, unspecified: Secondary | ICD-10-CM

## 2023-09-25 DIAGNOSIS — Z7984 Long term (current) use of oral hypoglycemic drugs: Secondary | ICD-10-CM | POA: Diagnosis not present

## 2023-09-25 DIAGNOSIS — E559 Vitamin D deficiency, unspecified: Secondary | ICD-10-CM | POA: Diagnosis not present

## 2023-09-25 DIAGNOSIS — I10 Essential (primary) hypertension: Secondary | ICD-10-CM | POA: Diagnosis not present

## 2023-09-25 DIAGNOSIS — R5383 Other fatigue: Secondary | ICD-10-CM

## 2023-09-25 DIAGNOSIS — K219 Gastro-esophageal reflux disease without esophagitis: Secondary | ICD-10-CM | POA: Diagnosis not present

## 2023-09-25 DIAGNOSIS — R1013 Epigastric pain: Secondary | ICD-10-CM

## 2023-09-25 DIAGNOSIS — E118 Type 2 diabetes mellitus with unspecified complications: Secondary | ICD-10-CM

## 2023-09-25 DIAGNOSIS — E538 Deficiency of other specified B group vitamins: Secondary | ICD-10-CM | POA: Diagnosis not present

## 2023-09-25 DIAGNOSIS — F411 Generalized anxiety disorder: Secondary | ICD-10-CM

## 2023-09-25 MED ORDER — BLOOD GLUCOSE TEST VI STRP: 1.0000 | ORAL_STRIP | Freq: Three times a day (TID) | 0 refills | Status: DC

## 2023-09-25 MED ORDER — LANCETS MISC. MISC: 1.0000 | Freq: Three times a day (TID) | 0 refills | Status: AC

## 2023-09-25 MED ORDER — OMEPRAZOLE 20 MG PO CPDR: 20.0000 mg | DELAYED_RELEASE_CAPSULE | Freq: Two times a day (BID) | ORAL | 3 refills | Status: DC

## 2023-09-25 MED ORDER — LANCET DEVICE MISC: 1.0000 | Freq: Three times a day (TID) | 0 refills | Status: AC

## 2023-09-25 MED ORDER — BLOOD GLUCOSE MONITORING SUPPL DEVI: 1.0000 | Freq: Three times a day (TID) | 0 refills | Status: DC

## 2023-09-25 NOTE — Assessment & Plan Note (Signed)
 Remains adequately controlled on current antihypertensive regimen.  No medication changes are indicated today.

## 2023-09-25 NOTE — Assessment & Plan Note (Addendum)
A1c 6.1 on labs from September 2023.  She remains on Farxiga but is no longer taking metformin. -Repeat A1c and urine microalbumin/creatinine ratio ordered today -Glucometer kit prescribed today

## 2023-09-25 NOTE — Assessment & Plan Note (Signed)
Lipid panel updated in July.  Total cholesterol 268 and LDL 168.  She is currently prescribed atorvastatin 80 mg daily and would like to review indications for continuing atorvastatin.  We discussed that her LDL is significantly elevated and that a more aggressive approach to hyperlipidemia control is recommended in the setting of diabetes mellitus.  She is in agreement with continuing atorvastatin.  Repeat lipid panel ordered today.

## 2023-09-25 NOTE — Assessment & Plan Note (Signed)
Her acute concern today is fatigue that has worsened over the last month.  Unclear etiology.  Largely denies additional symptoms. -Repeat labs ordered today to assess for underlying metabolic etiology.

## 2023-09-25 NOTE — Assessment & Plan Note (Signed)
Mood remains stable and anxiety adequately controlled with Celexa.  No changes are indicated today.

## 2023-09-25 NOTE — Patient Instructions (Signed)
It was a pleasure to see you today.  Thank you for giving [place] the opportunity to be involved in your care.  Below is a brief recap of your visit and next steps.  We will plan to see you again in 6 months.  Summary Discontinue protonix and start omeprazole 20 mg twice daily Repeat labs Follow up in 6 months

## 2023-09-25 NOTE — Progress Notes (Signed)
Established Patient Office Visit  Subjective   Patient ID: Audrey Ross, female    DOB: 1951/07/29  Age: 72 y.o. MRN: [id number]  Chief Complaint  Patient presents with   Follow-up   Audrey Ross returns to care today for routine follow-up.  She was last evaluated by me on 5/21.  At that time she requested a referral to podiatry for evaluation of a hammertoe.  No medication changes were made and 61-month follow-up was arranged.  In the interim, she has been evaluated by gynecology.  There have otherwise been no acute interval events.  Audrey Ross reports feeling fairly well today.  She endorses recent fatigue as well as having to clear her throat more frequently.  Symptoms seem to occur most often after meals and when lying down.  Her additional concern today is wanting to review indications for continuing atorvastatin.  Past Medical History:  Diagnosis Date   Allergy    hay fever   Anxiety    Arthritis    R knee. R foot, L shoudler   Cancer [facility])    hysterectomy 2009 cervical   Diabetes mellitus without complication (HCC)    GERD (gastroesophageal reflux disease)    Hyperlipidemia    Hypertension    Preventative health care 01/29/2022   Sleep apnea    no longer uses   Past Surgical History:  Procedure Laterality Date   ABDOMINAL HYSTERECTOMY     2009   CARPAL TUNNEL RELEASE     both hands   HEMORRHOID SURGERY     in high school   STAPEDES SURGERY     had 3 surgeries   TONSILLECTOMY     TUBAL LIGATION     Social History   Tobacco Use   Smoking status: Never   Smokeless tobacco: Never  Substance Use Topics   Alcohol use: No   Drug use: No   Family History  Problem Relation Age of Onset   Arthritis Mother        "crippled"   Diabetes Mother    Hearing loss Mother    Heart disease Mother    Hearing loss Father    Vision loss Father    Cancer Brother        bladder   Diabetes Brother    Hyperlipidemia Brother    Early death Maternal Grandfather    Breast cancer Neg  Hx    Lung cancer Neg Hx    Colon cancer Neg Hx    Allergies  Allergen Reactions   Zoster Vaccine Live Swelling   Review of Systems  Constitutional:  Positive for malaise/fatigue.  Gastrointestinal:  Positive for heartburn.  All other systems reviewed and are negative.    Objective:     BP 131/76   Pulse 74   Ht 5\' 9"  (1.753 m)   Wt 190 lb (86.2 kg)   SpO2 95%   BMI 28.06 kg/m  BP Readings from Last 3 Encounters:  09/25/23 131/76  03/27/23 127/79  03/17/23 121/75   Physical Exam Vitals reviewed.  Constitutional:      General: She is not in acute distress.    Appearance: Normal appearance. She is not toxic-appearing.  HENT:     Head: Normocephalic and atraumatic.     Right Ear: External ear normal.     Left Ear: External ear normal.     Nose: Nose normal. No congestion or rhinorrhea.     Mouth/Throat:     Mouth: Mucous membranes are moist.  Pharynx: Oropharynx is clear. No oropharyngeal exudate or posterior oropharyngeal erythema.  Eyes:     General: No scleral icterus.    Extraocular Movements: Extraocular movements intact.     Conjunctiva/sclera: Conjunctivae normal.     Pupils: Pupils are equal, round, and reactive to light.  Cardiovascular:     Rate and Rhythm: Normal rate and regular rhythm.     Pulses: Normal pulses.     Heart sounds: Normal heart sounds. No murmur heard.    No friction rub. No gallop.  Pulmonary:     Effort: Pulmonary effort is normal.     Breath sounds: Normal breath sounds. No wheezing, rhonchi or rales.  Abdominal:     General: Abdomen is flat. Bowel sounds are normal. There is no distension.     Palpations: Abdomen is soft.     Tenderness: There is no abdominal tenderness.  Musculoskeletal:        General: No swelling. Normal range of motion.     Cervical back: Normal range of motion.     Right lower leg: No edema.     Left lower leg: No edema.  Lymphadenopathy:     Cervical: No cervical adenopathy.  Skin:    General:  Skin is warm and dry.     Capillary Refill: Capillary refill takes less than 2 seconds.     Coloration: Skin is not jaundiced.  Neurological:     General: No focal deficit present.     Mental Status: She is alert and oriented to person, place, and time.  Psychiatric:        Mood and Affect: Mood normal.        Behavior: Behavior normal.   Last CBC Lab Results  Component Value Date   WBC 5.9 06/27/2022   HGB 13.9 06/27/2022   HCT 41.0 06/27/2022   MCV 90 06/27/2022   MCH 30.4 06/27/2022   RDW 12.0 06/27/2022   PLT 251 06/27/2022   Last metabolic panel Lab Results  Component Value Date   GLUCOSE 99 06/27/2022   NA 143 06/27/2022   K 4.7 06/27/2022   CL 104 06/27/2022   CO2 19 (L) 06/27/2022   BUN 18 06/27/2022   CREATININE 0.79 06/27/2022   EGFR 79 06/27/2022   CALCIUM 9.9 06/27/2022   PROT 6.6 06/27/2022   ALBUMIN 4.5 06/27/2022   LABGLOB 2.1 06/27/2022   AGRATIO 2.1 06/27/2022   BILITOT 0.3 06/27/2022   ALKPHOS 65 06/27/2022   AST 21 06/27/2022   ALT 17 06/27/2022   Last lipids Lab Results  Component Value Date   CHOL 268 (H) 04/20/2023   HDL 71 04/20/2023   LDLCALC 168 (H) 04/20/2023   TRIG 161 (H) 04/20/2023   CHOLHDL 3.8 04/20/2023   Last hemoglobin A1c Lab Results  Component Value Date   HGBA1C 6.1 (H) 06/27/2022   Last thyroid functions Lab Results  Component Value Date   TSH 2.600 06/27/2022   Last vitamin D Lab Results  Component Value Date   VD25OH 46.9 06/27/2022   Last vitamin B12 and Folate Lab Results  Component Value Date   VITAMINB12 1,341 (H) 06/27/2022   FOLATE 17.8 06/27/2022   The 10-year ASCVD risk score (Arnett DK, et al., 2019) is: 35.8%    Assessment & Plan:   Problem List Items Addressed This Visit       Essential hypertension - Primary   Remains adequately controlled on current antihypertensive regimen.  No medication changes are indicated today.  Chronic GERD   History of chronic GERD.  Symptoms  previously well-controlled with Protonix, however today she reports that she is clearing her throat more frequently despite taking Protonix 40 mg twice daily. -Switch to omeprazole 20 mg twice daily -She will return to care if symptoms worsen or fail to improve with today's medication changes      Controlled diabetes mellitus type 2 with complications (HCC)   A1c 6.1 on labs from September 2023.  She remains on Farxiga but is no longer taking metformin. -Repeat A1c and urine microalbumin/creatinine ratio ordered today -Glucometer kit prescribed today      HLD (hyperlipidemia)   Lipid panel updated in July.  Total cholesterol 268 and LDL 168.  She is currently prescribed atorvastatin 80 mg daily and would like to review indications for continuing atorvastatin.  We discussed that her LDL is significantly elevated and that a more aggressive approach to hyperlipidemia control is recommended in the setting of diabetes mellitus.  She is in agreement with continuing atorvastatin.  Repeat lipid panel ordered today.      GAD (generalized anxiety disorder)   Mood remains stable and anxiety adequately controlled with Celexa.  No changes are indicated today.      Fatigue   Her acute concern today is fatigue that has worsened over the last month.  Unclear etiology.  Largely denies additional symptoms. -Repeat labs ordered today to assess for underlying metabolic etiology.      Return in about 6 months (around 03/25/2024).   Billie Lade, MD

## 2023-09-25 NOTE — Assessment & Plan Note (Signed)
History of chronic GERD.  Symptoms previously well-controlled with Protonix, however today she reports that she is clearing her throat more frequently despite taking Protonix 40 mg twice daily. -Switch to omeprazole 20 mg twice daily -She will return to care if symptoms worsen or fail to improve with today's medication changes

## 2023-09-26 ENCOUNTER — Other Ambulatory Visit: Payer: Self-pay | Admitting: Internal Medicine

## 2023-09-26 DIAGNOSIS — M25511 Pain in right shoulder: Secondary | ICD-10-CM

## 2023-09-27 LAB — B12 AND FOLATE PANEL
Folate: 15.4 ng/mL (ref >3.0)
Vitamin B-12: 887 pg/mL (ref 232–1245)

## 2023-09-27 LAB — CMP14+EGFR
ALT: 15 [IU]/L (ref 0–32)
AST: 17 [IU]/L (ref 0–40)
Albumin: 4.2 g/dL (ref 3.8–4.8)
Alkaline Phosphatase: 65 [IU]/L (ref 44–121)
BUN/Creatinine Ratio: 26 (ref 12–28)
BUN: 20 mg/dL (ref 8–27)
Bilirubin Total: 0.3 mg/dL (ref 0.0–1.2)
CO2: 26 mmol/L (ref 20–29)
Calcium: 9.8 mg/dL (ref 8.7–10.3)
Chloride: 104 mmol/L (ref 96–106)
Creatinine, Ser: 0.77 mg/dL (ref 0.57–1.00)
Globulin, Total: 2.2 g/dL (ref 1.5–4.5)
Glucose: 93 mg/dL (ref 70–99)
Potassium: 4.3 mmol/L (ref 3.5–5.2)
Sodium: 141 mmol/L (ref 134–144)
Total Protein: 6.4 g/dL (ref 6.0–8.5)
eGFR: 81 mL/min/{1.73_m2} (ref >59)

## 2023-09-27 LAB — CBC WITH DIFFERENTIAL/PLATELET
Basophils Absolute: 0.1 10*3/uL (ref 0.0–0.2)
Basos: 1 %
EOS (ABSOLUTE): 0.2 10*3/uL (ref 0.0–0.4)
Eos: 3 %
Hematocrit: 43.7 % (ref 34.0–46.6)
Hemoglobin: 14.5 g/dL (ref 11.1–15.9)
Immature Grans (Abs): 0 10*3/uL (ref 0.0–0.1)
Immature Granulocytes: 0 %
Lymphocytes Absolute: 2.9 10*3/uL (ref 0.7–3.1)
Lymphs: 45 %
MCH: 30.8 pg (ref 26.6–33.0)
MCHC: 33.2 g/dL (ref 31.5–35.7)
MCV: 93 fL (ref 79–97)
Monocytes Absolute: 0.5 10*3/uL (ref 0.1–0.9)
Monocytes: 8 %
Neutrophils Absolute: 2.8 10*3/uL (ref 1.4–7.0)
Neutrophils: 43 %
Platelets: 251 10*3/uL (ref 150–450)
RBC: 4.71 x10E6/uL (ref 3.77–5.28)
RDW: 12.4 % (ref 11.7–15.4)
WBC: 6.5 10*3/uL (ref 3.4–10.8)

## 2023-09-27 LAB — IRON,TIBC AND FERRITIN PANEL
Ferritin: 51 ng/mL (ref 15–150)
Iron Saturation: 40 % (ref 15–55)
Iron: 121 ug/dL (ref 27–139)
Total Iron Binding Capacity: 300 ug/dL (ref 250–450)
UIBC: 179 ug/dL (ref 118–369)

## 2023-09-27 LAB — LIPID PANEL
Chol/HDL Ratio: 3.9 {ratio} (ref 0.0–4.4)
Cholesterol, Total: 283 mg/dL — ABNORMAL HIGH (ref 100–199)
HDL: 73 mg/dL (ref >39)
LDL Chol Calc (NIH): 169 mg/dL — ABNORMAL HIGH (ref 0–99)
Triglycerides: 222 mg/dL — ABNORMAL HIGH (ref 0–149)
VLDL Cholesterol Cal: 41 mg/dL — ABNORMAL HIGH (ref 5–40)

## 2023-09-27 LAB — TSH+FREE T4
Free T4: 0.83 ng/dL (ref 0.82–1.77)
TSH: 3.08 u[IU]/mL (ref 0.450–4.500)

## 2023-09-27 LAB — HEMOGLOBIN A1C
Est. average glucose Bld gHb Est-mCnc: 128 mg/dL
Hgb A1c MFr Bld: 6.1 % — ABNORMAL HIGH (ref 4.8–5.6)

## 2023-09-27 LAB — MICROALBUMIN / CREATININE URINE RATIO
Creatinine, Urine: 90.7 mg/dL
Microalb/Creat Ratio: 4 mg/g{creat} (ref 0–29)
Microalbumin, Urine: 3.4 ug/mL

## 2023-09-27 LAB — VITAMIN D 25 HYDROXY (VIT D DEFICIENCY, FRACTURES): Vit D, 25-Hydroxy: 38.2 ng/mL (ref 30.0–100.0)

## 2023-10-03 ENCOUNTER — Encounter: Payer: Self-pay | Admitting: Internal Medicine

## 2023-10-03 ENCOUNTER — Other Ambulatory Visit: Payer: Self-pay | Admitting: Internal Medicine

## 2023-10-05 ENCOUNTER — Other Ambulatory Visit: Payer: Self-pay

## 2023-10-05 DIAGNOSIS — M25511 Pain in right shoulder: Secondary | ICD-10-CM

## 2023-10-05 MED ORDER — MELOXICAM 15 MG PO TABS: 15.0000 mg | ORAL_TABLET | Freq: Every day | ORAL | 0 refills | Status: DC

## 2023-10-05 NOTE — Telephone Encounter (Signed)
REFILL SENT 

## 2023-11-04 ENCOUNTER — Other Ambulatory Visit: Payer: Self-pay | Admitting: Internal Medicine

## 2023-12-14 ENCOUNTER — Other Ambulatory Visit: Payer: Self-pay | Admitting: Internal Medicine

## 2023-12-14 DIAGNOSIS — E118 Type 2 diabetes mellitus with unspecified complications: Secondary | ICD-10-CM

## 2023-12-15 ENCOUNTER — Encounter: Payer: Self-pay | Admitting: Internal Medicine

## 2023-12-16 NOTE — Telephone Encounter (Signed)
 Note added to pt messages on chart

## 2024-01-09 ENCOUNTER — Other Ambulatory Visit: Payer: Self-pay | Admitting: Internal Medicine

## 2024-01-17 ENCOUNTER — Other Ambulatory Visit: Payer: Self-pay | Admitting: Internal Medicine

## 2024-01-17 DIAGNOSIS — E118 Type 2 diabetes mellitus with unspecified complications: Secondary | ICD-10-CM

## 2024-01-31 ENCOUNTER — Other Ambulatory Visit: Payer: Self-pay | Admitting: Internal Medicine

## 2024-01-31 DIAGNOSIS — R1013 Epigastric pain: Secondary | ICD-10-CM

## 2024-02-21 ENCOUNTER — Other Ambulatory Visit: Payer: Self-pay | Admitting: Internal Medicine

## 2024-02-21 DIAGNOSIS — E118 Type 2 diabetes mellitus with unspecified complications: Secondary | ICD-10-CM

## 2024-02-26 ENCOUNTER — Ambulatory Visit

## 2024-02-27 ENCOUNTER — Other Ambulatory Visit: Payer: Self-pay | Admitting: Internal Medicine

## 2024-02-27 DIAGNOSIS — R1013 Epigastric pain: Secondary | ICD-10-CM

## 2024-03-02 ENCOUNTER — Other Ambulatory Visit: Payer: Self-pay | Admitting: Internal Medicine

## 2024-03-04 ENCOUNTER — Ambulatory Visit

## 2024-03-10 ENCOUNTER — Ambulatory Visit

## 2024-03-12 IMAGING — MG MM DIGITAL SCREENING BILAT W/ TOMO AND CAD
8 series · 8 of 24 positions shown · non-contrast
Comparison: Previous exam(s).

CLINICAL DATA: Screening.

EXAM:
DIGITAL SCREENING BILATERAL MAMMOGRAM WITH TOMOSYNTHESIS AND CAD
TECHNIQUE: Bilateral screening digital craniocaudal and mediolateral oblique
mammograms were obtained. Bilateral screening digital breast
tomosynthesis was performed. The images were evaluated with
computer-aided detection.

[R CC synth-2D]
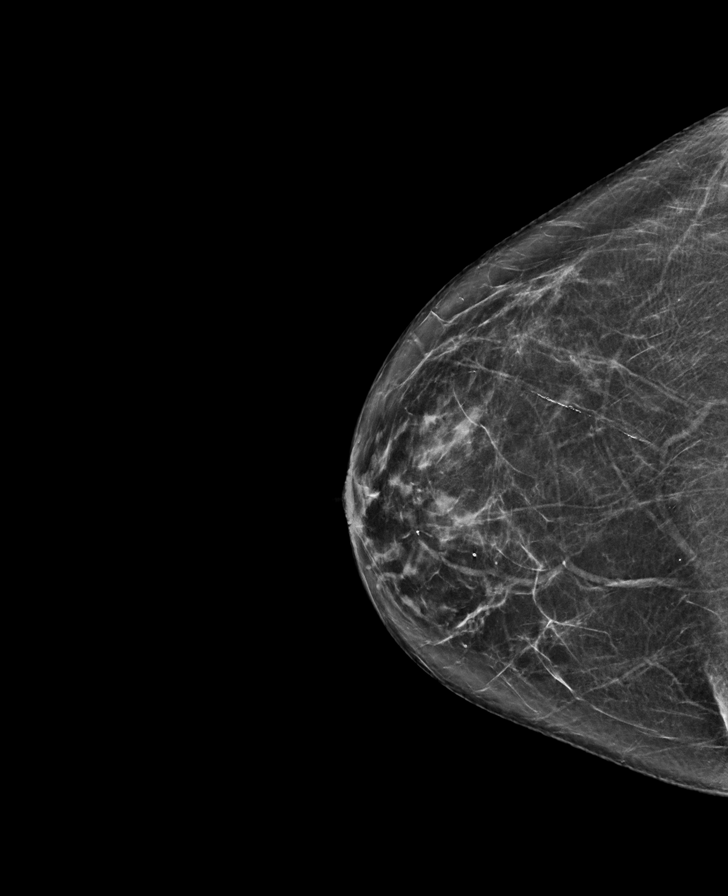

[R MLO synth-2D]
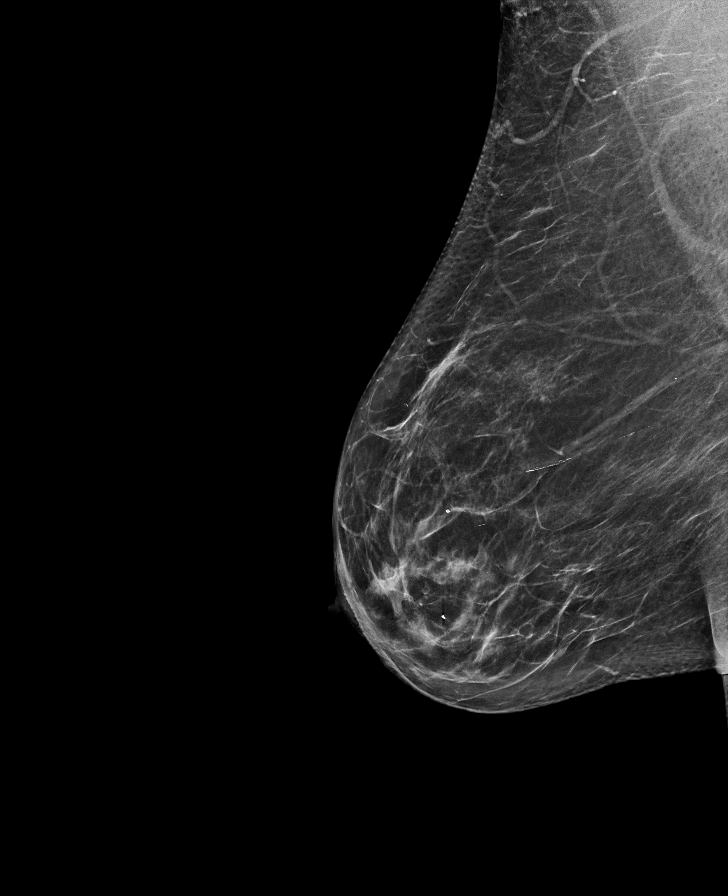

[L MLO synth-2D]
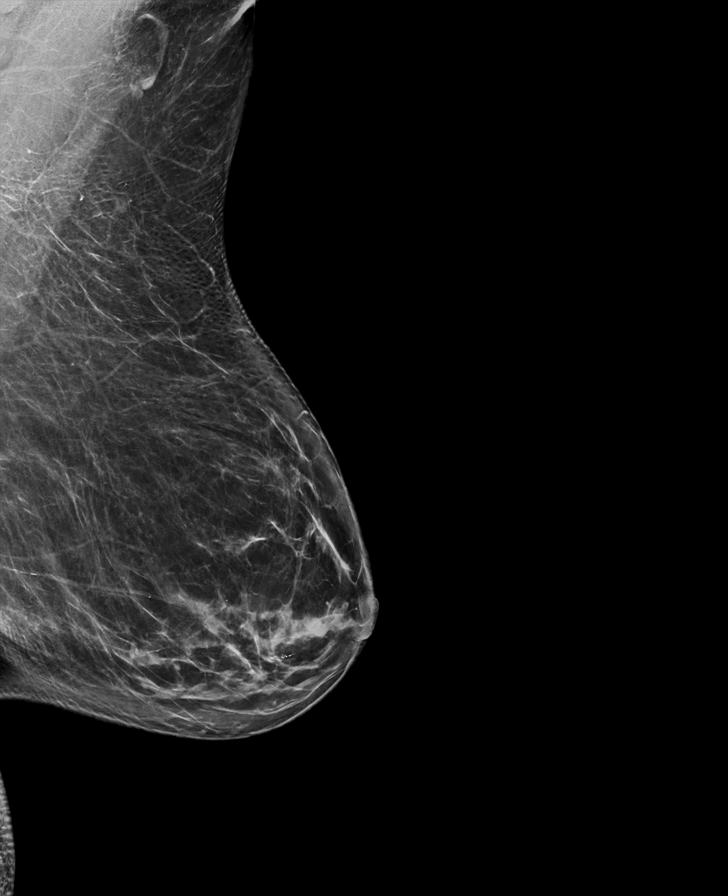

[L CC synth-2D]
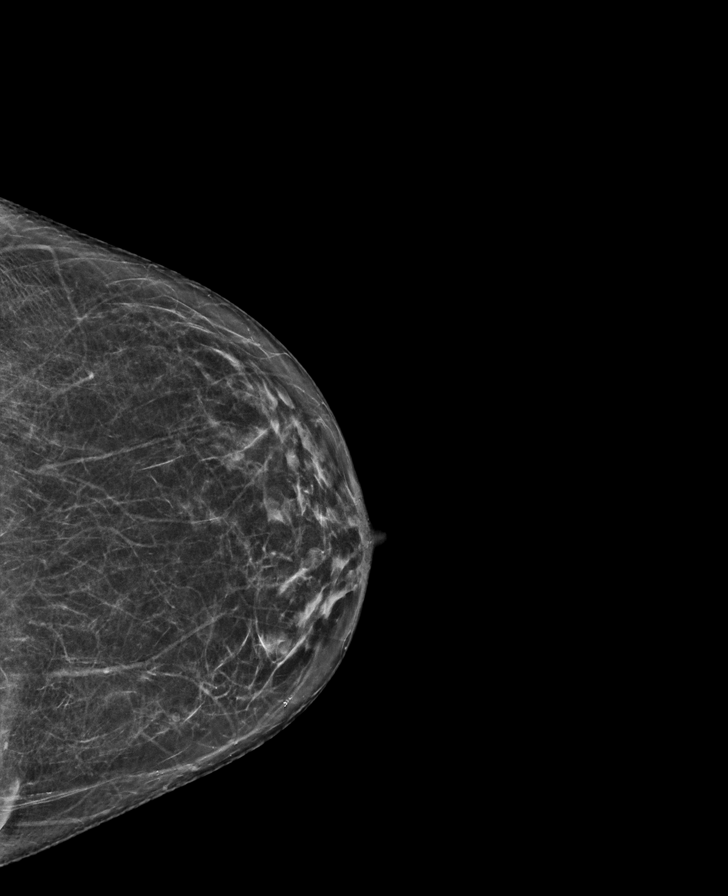

[R MLO tomo · tomo slice 36/71.0]
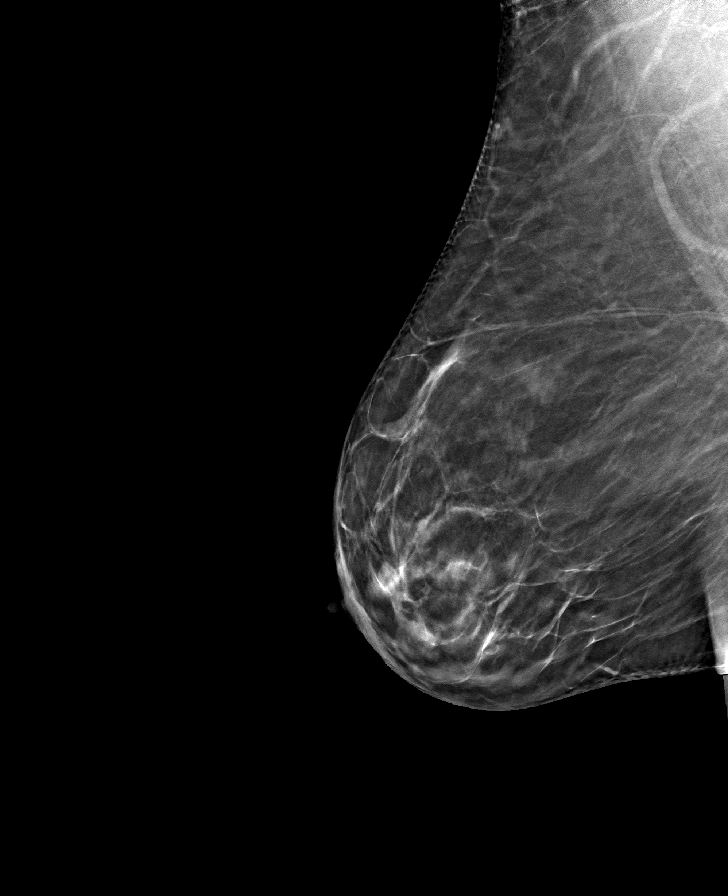

[L CC tomo · tomo slice 31/61.0]
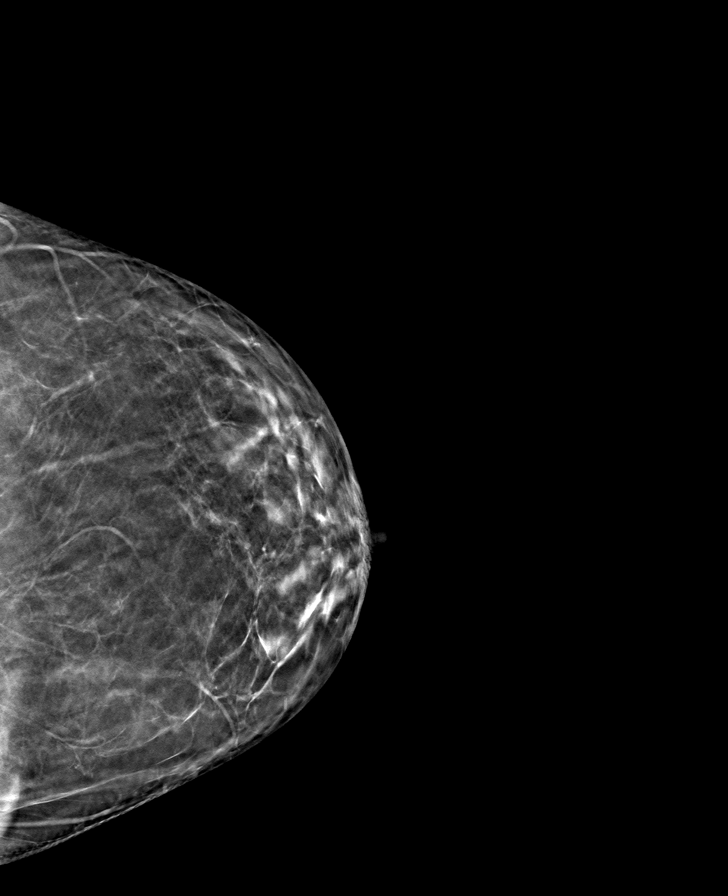

[R CC tomo · tomo slice 35/68.0]
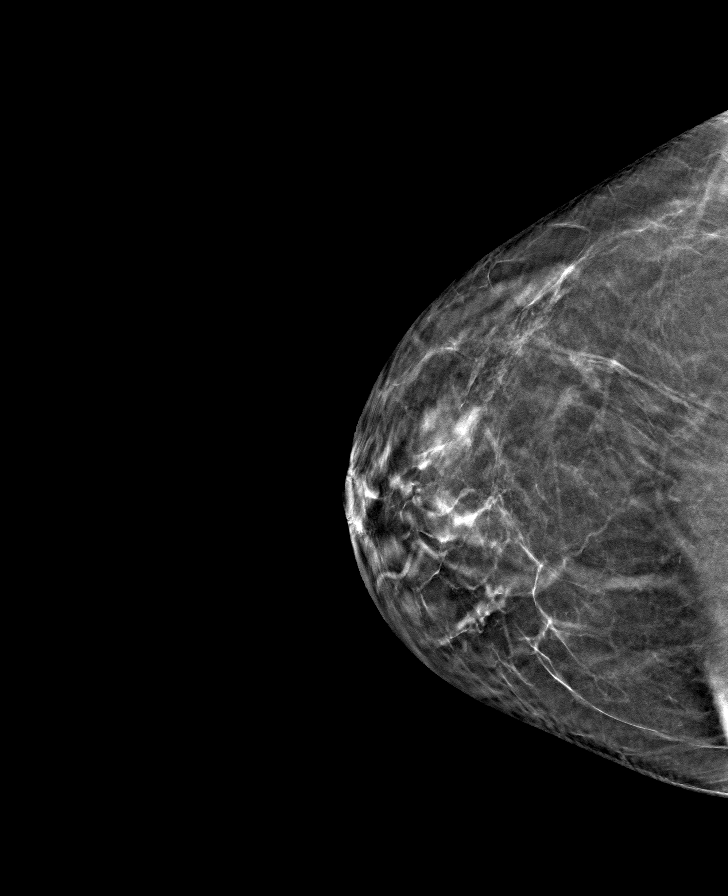

[L MLO tomo · tomo slice 39/76.0]
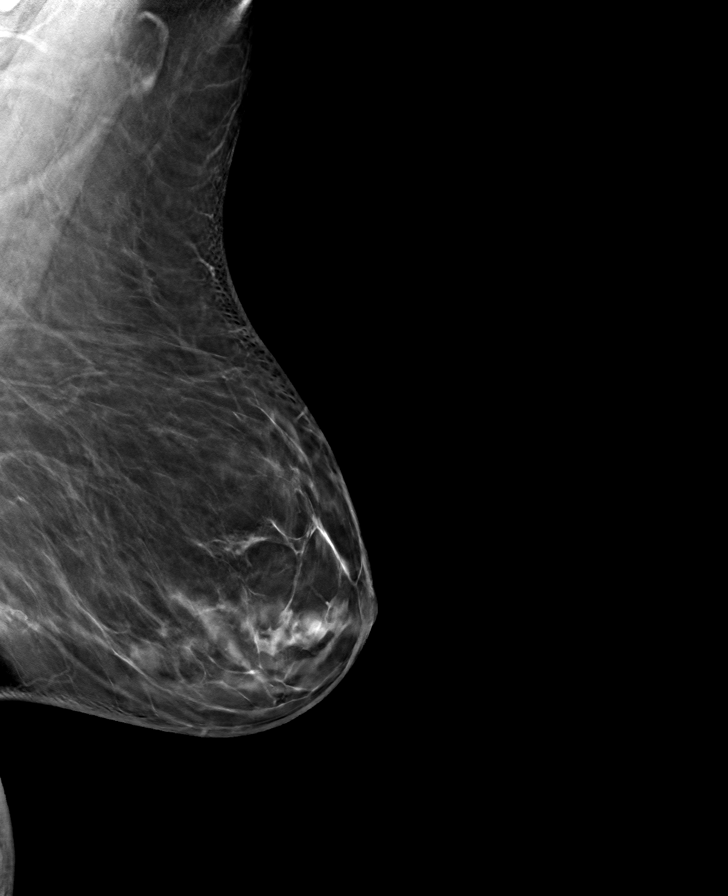

[8 of 24 positions shown; findings below may reference images not displayed]

ACR Breast Density Category b: There are scattered areas of
fibroglandular density.
FINDINGS: There are no findings suspicious for malignancy.
IMPRESSION: No mammographic evidence of malignancy. A result letter of this
screening mammogram will be mailed directly to the patient.

RECOMMENDATION:
Screening mammogram in one year. (Code:51-O-LD2)

BI-RADS CATEGORY  1: Negative.

## 2024-03-28 ENCOUNTER — Ambulatory Visit: Payer: Medicare HMO | Admitting: Internal Medicine

## 2024-04-01 ENCOUNTER — Other Ambulatory Visit: Payer: Self-pay | Admitting: Internal Medicine

## 2024-04-01 DIAGNOSIS — R1013 Epigastric pain: Secondary | ICD-10-CM

## 2024-04-07 ENCOUNTER — Other Ambulatory Visit: Payer: Self-pay | Admitting: Internal Medicine

## 2024-04-16 ENCOUNTER — Other Ambulatory Visit: Payer: Self-pay | Admitting: Internal Medicine

## 2024-04-16 DIAGNOSIS — M25511 Pain in right shoulder: Secondary | ICD-10-CM

## 2024-04-28 ENCOUNTER — Other Ambulatory Visit: Payer: Self-pay | Admitting: Internal Medicine

## 2024-05-17 ENCOUNTER — Ambulatory Visit: Payer: Self-pay

## 2024-05-17 VITALS — BP 109/64 | HR 71 | Ht 69.0 in | Wt 189.0 lb

## 2024-05-17 DIAGNOSIS — M79642 Pain in left hand: Secondary | ICD-10-CM | POA: Diagnosis not present

## 2024-05-17 DIAGNOSIS — E538 Deficiency of other specified B group vitamins: Secondary | ICD-10-CM | POA: Diagnosis not present

## 2024-05-17 DIAGNOSIS — E118 Type 2 diabetes mellitus with unspecified complications: Secondary | ICD-10-CM | POA: Diagnosis not present

## 2024-05-17 DIAGNOSIS — E559 Vitamin D deficiency, unspecified: Secondary | ICD-10-CM

## 2024-05-17 DIAGNOSIS — Z1231 Encounter for screening mammogram for malignant neoplasm of breast: Secondary | ICD-10-CM

## 2024-05-17 DIAGNOSIS — I10 Essential (primary) hypertension: Secondary | ICD-10-CM

## 2024-05-17 DIAGNOSIS — E782 Mixed hyperlipidemia: Secondary | ICD-10-CM

## 2024-05-17 DIAGNOSIS — Z7984 Long term (current) use of oral hypoglycemic drugs: Secondary | ICD-10-CM | POA: Diagnosis not present

## 2024-05-17 MED ORDER — FARXIGA 10 MG PO TABS: 10.0000 mg | ORAL_TABLET | Freq: Every day | ORAL | 3 refills | Status: AC

## 2024-05-17 MED ORDER — ONETOUCH ULTRA VI STRP: ORAL_STRIP | 5 refills | Status: DC

## 2024-05-17 NOTE — Progress Notes (Signed)
 Established Patient Office Visit  Subjective   Patient ID: Audrey Ross, female    DOB: 06-18-1951  Age: 73 y.o. MRN: [id number]  Chief Complaint  Patient presents with   Transitions Of Care    Transfer of care from Dr Melvenia to Leita Fritter Injury    Clemens about two months ago, landed on left hand, painful     Hand Injury     Patient Active Problem List   Diagnosis Date Noted   Hammertoe of right foot 02/26/2023   Sebaceous cyst of labia 11/14/2022   Screening for colon cancer 10/20/2022   Dyspepsia 08/21/2022   Increased urinary frequency 08/21/2022   Bilateral shoulder pain 08/21/2022   Fatigue 06/27/2022   Vitamin B 12 deficiency 01/29/2022   Trauma to ear 01/29/2022   Vitamin D  deficiency 10/29/2021   Encounter for screening mammogram for malignant neoplasm of breast 10/29/2021   Refused influenza vaccine 10/29/2021   Controlled diabetes mellitus type 2 with complications (HCC) 05/07/2017   Essential hypertension 05/07/2017   HLD (hyperlipidemia) 05/07/2017   GAD (generalized anxiety disorder) 05/07/2017   Environmental allergies 05/07/2017   Restless legs syndrome (RLS) 05/07/2017   Chronic GERD 05/07/2017   Generalized osteoarthritis of multiple sites 05/07/2017      ROS    Objective:     BP 109/64   Pulse 71   Ht 5' 9 (1.753 m)   Wt 189 lb (85.7 kg)   SpO2 96%   BMI 27.91 kg/m  BP Readings from Last 3 Encounters:  05/17/24 109/64  09/25/23 131/76  03/27/23 127/79   Wt Readings from Last 3 Encounters:  05/17/24 189 lb (85.7 kg)  09/25/23 190 lb (86.2 kg)  02/24/23 193 lb 9.6 oz (87.8 kg)      Physical Exam Vitals and nursing note reviewed.  Constitutional:      Appearance: Normal appearance.  HENT:     Head: Normocephalic.  Eyes:     Extraocular Movements: Extraocular movements intact.     Pupils: Pupils are equal, round, and reactive to light.  Cardiovascular:     Rate and Rhythm: Normal rate and regular rhythm.  Pulmonary:      Effort: Pulmonary effort is normal.     Breath sounds: Normal breath sounds.  Musculoskeletal:     Right hand: Normal.     Left hand: Tenderness present. No swelling. Normal range of motion. Decreased strength.       Hands:     Cervical back: Normal range of motion and neck supple.  Neurological:     Mental Status: She is alert and oriented to person, place, and time.  Psychiatric:        Mood and Affect: Mood normal.        Thought Content: Thought content normal.     Last CBC Lab Results  Component Value Date   WBC 6.5 09/25/2023   HGB 14.5 09/25/2023   HCT 43.7 09/25/2023   MCV 93 09/25/2023   MCH 30.8 09/25/2023   RDW 12.4 09/25/2023   PLT 251 09/25/2023   Last metabolic panel Lab Results  Component Value Date   GLUCOSE 114 (H) 05/17/2024   NA 143 05/17/2024   K 4.2 05/17/2024   CL 105 05/17/2024   CO2 24 05/17/2024   BUN 19 05/17/2024   CREATININE 0.77 05/17/2024   EGFR 81 05/17/2024   CALCIUM  9.4 05/17/2024   PROT 6.7 05/17/2024   ALBUMIN 4.2 05/17/2024   LABGLOB 2.5 05/17/2024  AGRATIO 2.1 06/27/2022   BILITOT 0.4 05/17/2024   ALKPHOS 71 05/17/2024   AST 19 05/17/2024   ALT 10 05/17/2024   Last lipids Lab Results  Component Value Date   CHOL 288 (H) 05/17/2024   HDL 73 05/17/2024   LDLCALC 196 (H) 05/17/2024   TRIG 110 05/17/2024   CHOLHDL 3.9 05/17/2024   Last hemoglobin A1c Lab Results  Component Value Date   HGBA1C 6.2 (H) 05/17/2024   Last thyroid  functions Lab Results  Component Value Date   TSH 3.080 09/25/2023   Last vitamin D  Lab Results  Component Value Date   VD25OH 36.9 05/17/2024      The 10-year ASCVD risk score (Arnett DK, et al., 2019) is: 26.6%    Assessment & Plan:   Problem List Items Addressed This Visit       Cardiovascular and Mediastinum   Essential hypertension   Remains adequately controlled on current antihypertensive regimen.  No medication changes are indicated today.      Relevant Orders    CMP14+EGFR (Completed)     Endocrine   Controlled diabetes mellitus type 2 with complications (HCC) - Primary   A1c 6.1 on labs from December 2024.  She remains on Farxiga .  -Repeat A1c and urine microalbumin/creatinine ratio ordered today       Relevant Medications   FARXIGA  10 MG TABS tablet   glucose blood (ONETOUCH ULTRA) test strip   Other Relevant Orders   CMP14+EGFR (Completed)   Hemoglobin A1c (Completed)   Microalbumin / creatinine urine ratio (Completed)   Lipid panel (Completed)     Other   HLD (hyperlipidemia)   Relevant Orders   CMP14+EGFR (Completed)   Lipid panel (Completed)   Vitamin D  deficiency   Recheck vitamin D  levels. She is on daily supplementation.       Relevant Orders   Vitamin D  (25 hydroxy) (Completed)   Encounter for screening mammogram for malignant neoplasm of breast   Update mammogram for screening purposes      Relevant Orders   MM 3D SCREENING MAMMOGRAM BILATERAL BREAST   Vitamin B 12 deficiency   Recheck B12 levels.        Relevant Orders   B12 and Folate Panel (Completed)   Other Visit Diagnoses       Left hand pain       suspect sprain.  Recommend wearing wrist brace with thumb immobilizer and applying Voltaren gel as needed for pain relief.       Return in about 6 months (around 11/17/2024) for chronic follow-up with PCP.    Leita Longs, FNP

## 2024-05-18 ENCOUNTER — Ambulatory Visit: Payer: Self-pay

## 2024-05-18 NOTE — Assessment & Plan Note (Signed)
 Recheck vitamin D  levels. She is on daily supplementation.

## 2024-05-18 NOTE — Assessment & Plan Note (Signed)
 A1c 6.1 on labs from December 2024.  She remains on Farxiga .  -Repeat A1c and urine microalbumin/creatinine ratio ordered today

## 2024-05-18 NOTE — Assessment & Plan Note (Signed)
 Update mammogram for screening purposes

## 2024-05-18 NOTE — Assessment & Plan Note (Signed)
Recheck B12 levels.   

## 2024-05-18 NOTE — Assessment & Plan Note (Signed)
 Remains adequately controlled on current antihypertensive regimen.  No medication changes are indicated today.

## 2024-05-19 ENCOUNTER — Other Ambulatory Visit: Payer: Self-pay

## 2024-05-19 DIAGNOSIS — E782 Mixed hyperlipidemia: Secondary | ICD-10-CM

## 2024-05-19 LAB — MICROALBUMIN / CREATININE URINE RATIO
Creatinine, Urine: 95.6 mg/dL
Microalb/Creat Ratio: <3 mg/g{creat} (ref 0–29)
Microalbumin, Urine: <3 ug/mL

## 2024-05-19 LAB — CMP14+EGFR
ALT: 10 IU/L (ref 0–32)
AST: 19 IU/L (ref 0–40)
Albumin: 4.2 g/dL (ref 3.8–4.8)
Alkaline Phosphatase: 71 IU/L (ref 44–121)
BUN/Creatinine Ratio: 25 (ref 12–28)
BUN: 19 mg/dL (ref 8–27)
Bilirubin Total: 0.4 mg/dL (ref 0.0–1.2)
CO2: 24 mmol/L (ref 20–29)
Calcium: 9.4 mg/dL (ref 8.7–10.3)
Chloride: 105 mmol/L (ref 96–106)
Creatinine, Ser: 0.77 mg/dL (ref 0.57–1.00)
Globulin, Total: 2.5 g/dL (ref 1.5–4.5)
Glucose: 114 mg/dL — ABNORMAL HIGH (ref 70–99)
Potassium: 4.2 mmol/L (ref 3.5–5.2)
Sodium: 143 mmol/L (ref 134–144)
Total Protein: 6.7 g/dL (ref 6.0–8.5)
eGFR: 81 mL/min/1.73 (ref >59)

## 2024-05-19 LAB — LIPID PANEL
Chol/HDL Ratio: 3.9 ratio (ref 0.0–4.4)
Cholesterol, Total: 288 mg/dL — ABNORMAL HIGH (ref 100–199)
HDL: 73 mg/dL (ref >39)
LDL Chol Calc (NIH): 196 mg/dL — ABNORMAL HIGH (ref 0–99)
Triglycerides: 110 mg/dL (ref 0–149)
VLDL Cholesterol Cal: 19 mg/dL (ref 5–40)

## 2024-05-19 LAB — HEMOGLOBIN A1C
Est. average glucose Bld gHb Est-mCnc: 131 mg/dL
Hgb A1c MFr Bld: 6.2 % — ABNORMAL HIGH (ref 4.8–5.6)

## 2024-05-19 LAB — B12 AND FOLATE PANEL
Folate: 17.6 ng/mL (ref >3.0)
Vitamin B-12: 604 pg/mL (ref 232–1245)

## 2024-05-19 LAB — VITAMIN D 25 HYDROXY (VIT D DEFICIENCY, FRACTURES): Vit D, 25-Hydroxy: 36.9 ng/mL (ref 30.0–100.0)

## 2024-05-19 MED ORDER — EZETIMIBE 10 MG PO TABS
10.0000 mg | ORAL_TABLET | Freq: Every day | ORAL | 2 refills | Status: AC
Start: 2024-05-19 — End: ?

## 2024-07-02 ENCOUNTER — Other Ambulatory Visit: Payer: Self-pay

## 2024-07-02 DIAGNOSIS — R1013 Epigastric pain: Secondary | ICD-10-CM

## 2024-07-07 ENCOUNTER — Other Ambulatory Visit: Payer: Self-pay

## 2024-07-09 ENCOUNTER — Other Ambulatory Visit: Payer: Self-pay

## 2024-07-09 DIAGNOSIS — M25511 Pain in right shoulder: Secondary | ICD-10-CM

## 2024-07-20 ENCOUNTER — Other Ambulatory Visit: Payer: Self-pay

## 2024-07-20 DIAGNOSIS — M79642 Pain in left hand: Secondary | ICD-10-CM

## 2024-07-22 ENCOUNTER — Ambulatory Visit (HOSPITAL_COMMUNITY)
Admission: RE | Admit: 2024-07-22 | Discharge: 2024-07-22 | Disposition: A | Discharge status: Home / Self Care | Source: Ambulatory Visit

## 2024-07-22 DIAGNOSIS — M79642 Pain in left hand: Secondary | ICD-10-CM | POA: Diagnosis not present

## 2024-07-23 ENCOUNTER — Ambulatory Visit: Payer: Self-pay

## 2024-07-31 ENCOUNTER — Other Ambulatory Visit: Payer: Self-pay

## 2024-07-31 DIAGNOSIS — R1013 Epigastric pain: Secondary | ICD-10-CM

## 2024-08-31 ENCOUNTER — Other Ambulatory Visit: Payer: Self-pay

## 2024-08-31 DIAGNOSIS — R1013 Epigastric pain: Secondary | ICD-10-CM

## 2024-08-31 DIAGNOSIS — E118 Type 2 diabetes mellitus with unspecified complications: Secondary | ICD-10-CM

## 2024-09-16 ENCOUNTER — Other Ambulatory Visit: Payer: Self-pay

## 2024-09-16 DIAGNOSIS — E118 Type 2 diabetes mellitus with unspecified complications: Secondary | ICD-10-CM

## 2024-09-16 MED ORDER — LANCETS MISC: 1.0000 | Freq: Every day | 11 refills | Status: AC

## 2024-09-16 MED ORDER — LANCET DEVICE MISC: 1.0000 | 0 refills | Status: AC

## 2024-09-16 MED ORDER — BLOOD GLUCOSE TEST VI STRP: 1.0000 | ORAL_STRIP | Freq: Every day | 11 refills | Status: AC

## 2024-09-16 MED ORDER — BLOOD GLUCOSE MONITORING SUPPL DEVI: 1.0000 | 0 refills | Status: AC

## 2024-09-16 NOTE — Telephone Encounter (Signed)
 Accu-chek meter sent to [facility]

## 2024-09-19 ENCOUNTER — Other Ambulatory Visit (HOSPITAL_COMMUNITY): Payer: Self-pay

## 2024-09-19 ENCOUNTER — Telehealth: Payer: Self-pay | Admitting: Pharmacy Technician

## 2024-09-19 NOTE — Telephone Encounter (Signed)
 Pharmacy Patient Advocate Encounter   Received notification from CoverMyMeds that prior authorization for Accu-Chek Guide Test strips is required/requested.   Insurance verification completed.   The patient is insured through [facility] MEDICARE.   Per test claim: Insurance allow for testing 3 times a day not 4 times a day. Can the prescription be reordered for 3x a day?  CMM Key# [id number] 

## 2024-09-23 ENCOUNTER — Other Ambulatory Visit: Payer: Self-pay

## 2024-09-23 DIAGNOSIS — E118 Type 2 diabetes mellitus with unspecified complications: Secondary | ICD-10-CM

## 2024-09-23 MED ORDER — BLOOD GLUCOSE TEST VI STRP: 1.0000 | ORAL_STRIP | Freq: Three times a day (TID) | 11 refills | Status: AC | PRN

## 2024-10-01 ENCOUNTER — Other Ambulatory Visit: Payer: Self-pay

## 2024-10-01 DIAGNOSIS — M25511 Pain in right shoulder: Secondary | ICD-10-CM

## 2024-10-02 ENCOUNTER — Other Ambulatory Visit: Payer: Self-pay

## 2024-10-02 DIAGNOSIS — R1013 Epigastric pain: Secondary | ICD-10-CM

## 2024-11-04 ENCOUNTER — Other Ambulatory Visit: Payer: Self-pay

## 2024-11-04 DIAGNOSIS — R1013 Epigastric pain: Secondary | ICD-10-CM

## 2024-11-07 NOTE — Progress Notes (Signed)
 Audrey Ross                                          MRN: [id number]   11/07/2024   The [facility] Quality Team Specialist reviewed this patient medical record for the purposes of chart review for care gap closure. The following were reviewed: chart review for care gap closure-diabetic eye exam.    [facility] Quality Team

## 2024-11-16 ENCOUNTER — Ambulatory Visit
